# Patient Record
Sex: Female | Born: 1970 | Race: White | Hispanic: Yes | Smoking: Current some day smoker
Health system: Southern US, Community
[De-identification: ages and names within clinical notes are randomized; demographics above are authoritative.]

## PROBLEM LIST (undated history)

## (undated) DIAGNOSIS — E785 Hyperlipidemia, unspecified: Secondary | ICD-10-CM

## (undated) DIAGNOSIS — L659 Nonscarring hair loss, unspecified: Secondary | ICD-10-CM

## (undated) HISTORY — DX: Nonscarring hair loss, unspecified: L65.9

## (undated) HISTORY — DX: Hyperlipidemia, unspecified: E78.5

---

## 2003-10-31 ENCOUNTER — Other Ambulatory Visit: Admission: RE | Admit: 2003-10-31 | Discharge: 2003-10-31 | Payer: Self-pay | Admitting: Obstetrics and Gynecology

## 2003-11-01 ENCOUNTER — Other Ambulatory Visit: Admission: RE | Admit: 2003-11-01 | Discharge: 2003-11-01 | Payer: Self-pay | Admitting: Obstetrics and Gynecology

## 2004-02-08 ENCOUNTER — Inpatient Hospital Stay (HOSPITAL_COMMUNITY): Admission: AD | Admit: 2004-02-08 | Discharge: 2004-02-12 | Payer: Self-pay | Admitting: *Deleted

## 2015-03-13 ENCOUNTER — Ambulatory Visit: Payer: Self-pay | Admitting: Physician Assistant

## 2015-03-13 ENCOUNTER — Encounter: Payer: Self-pay | Admitting: Physician Assistant

## 2015-03-13 VITALS — BP 124/80 | HR 90 | Temp 98.4°F | Ht 63.0 in | Wt 196.6 lb

## 2015-03-13 DIAGNOSIS — E785 Hyperlipidemia, unspecified: Secondary | ICD-10-CM

## 2015-03-13 DIAGNOSIS — F17210 Nicotine dependence, cigarettes, uncomplicated: Secondary | ICD-10-CM

## 2015-03-13 NOTE — Progress Notes (Signed)
   BP 124/80 mmHg  Pulse 90  Temp(Src) 98.4 F (36.9 C)  Ht 5\' 3"  (1.6 m)  Wt 196 lb 9.6 oz (89.177 kg)  BMI 34.83 kg/m2  SpO2 98%   Subjective:    Patient ID: Jessica Kennedy, female    DOB: 11/20/1970, 44 y.o.   MRN: [id number]  HPI: Jessica Kennedy is a 44 y.o. female presenting on 03/13/2015 for Hyperlipidemia   HPI   Pt is still going to faith in families She did not take the fish oil that was rec for her at her last OV.  Relevant past medical, surgical, family and social history reviewed and updated as indicated. Interim medical history since our last visit reviewed. Allergies and medications reviewed and updated.  Review of Systems  Constitutional: Negative for fever, chills, diaphoresis, appetite change, fatigue and unexpected weight change.  HENT: Positive for congestion and sneezing. Negative for dental problem, drooling, ear pain, facial swelling, hearing loss, mouth sores, sore throat, trouble swallowing and voice change.   Eyes: Positive for redness. Negative for pain, discharge, itching and visual disturbance.  Respiratory: Negative for cough, choking, shortness of breath and wheezing.   Cardiovascular: Negative for chest pain, palpitations and leg swelling.  Gastrointestinal: Negative for vomiting, abdominal pain, diarrhea, constipation and blood in stool.  Endocrine: Negative for cold intolerance, heat intolerance and polydipsia.  Genitourinary: Negative for dysuria, hematuria and decreased urine volume.  Musculoskeletal: Negative for back pain, arthralgias and gait problem.  Skin: Negative for rash.  Allergic/Immunologic: Negative for environmental allergies.  Neurological: Negative for seizures, syncope, light-headedness and headaches.  Hematological: Negative for adenopathy.  Psychiatric/Behavioral: Negative for suicidal ideas, dysphoric mood and agitation. The patient is not nervous/anxious.     Per HPI unless specifically  indicated above     Objective:    BP 124/80 mmHg  Pulse 90  Temp(Src) 98.4 F (36.9 C)  Ht 5\' 3"  (1.6 m)  Wt 196 lb 9.6 oz (89.177 kg)  BMI 34.83 kg/m2  SpO2 98%  Wt Readings from Last 3 Encounters:  03/13/15 196 lb 9.6 oz (89.177 kg)    Physical Exam  Constitutional: She is oriented to person, place, and time. She appears well-developed and well-nourished.  HENT:  Head: Normocephalic and atraumatic.  Neck: Neck supple.  Cardiovascular: Normal rate and regular rhythm.   Pulmonary/Chest: Effort normal and breath sounds normal.  Abdominal: Soft. Bowel sounds are normal. She exhibits no mass. There is no tenderness.  Musculoskeletal: She exhibits no edema.  Lymphadenopathy:    She has no cervical adenopathy.  Neurological: She is alert and oriented to person, place, and time.  Skin: Skin is warm and dry.  Psychiatric: She has a normal mood and affect. Her behavior is normal.  Vitals reviewed.   No results found for this or any previous visit.    Assessment & Plan:   Encounter Diagnoses  Name Primary?  . Hyperlipemia Yes  . Cigarette nicotine dependence, uncomplicated     -check fasting lipids. Will call with results -counseled on smoking cessaton -f/u 3 mo. rto sooner prn

## 2015-03-13 NOTE — Patient Instructions (Signed)
Fat and Cholesterol Restricted Diet High levels of fat and cholesterol in your blood may lead to various health problems, such as diseases of the heart, blood vessels, gallbladder, liver, and pancreas. Fats are concentrated sources of energy that come in various forms. Certain types of fat, including saturated fat, may be harmful in excess. Cholesterol is a substance needed by your body in small amounts. Your body makes all the cholesterol it needs. Excess cholesterol comes from the food you eat. When you have high levels of cholesterol and saturated fat in your blood, health problems can develop because the excess fat and cholesterol will gather along the walls of your blood vessels, causing them to narrow. Choosing the right foods will help you control your intake of fat and cholesterol. This will help keep the levels of these substances in your blood within normal limits and reduce your risk of disease. WHAT IS MY PLAN? Your health care provider recommends that you:  Get no more than __________ % of the total calories in your daily diet from fat.  Limit your intake of saturated fat to less than ______% of your total calories each day.  Limit the amount of cholesterol in your diet to less than _________mg per day. WHAT TYPES OF FAT SHOULD I CHOOSE?  Choose healthy fats more often. Choose monounsaturated and polyunsaturated fats, such as olive and canola oil, flaxseeds, walnuts, almonds, and seeds.  Eat more omega-3 fats. Good choices include salmon, mackerel, sardines, tuna, flaxseed oil, and ground flaxseeds. Aim to eat fish at least two times a week.  Limit saturated fats. Saturated fats are primarily found in animal products, such as meats, butter, and cream. Plant sources of saturated fats include palm oil, palm kernel oil, and coconut oil.  Avoid foods with partially hydrogenated oils in them. These contain trans fats. Examples of foods that contain trans fats are stick margarine, some tub  margarines, cookies, crackers, and other baked goods. WHAT GENERAL GUIDELINES DO I NEED TO FOLLOW? These guidelines for healthy eating will help you control your intake of fat and cholesterol:  Check food labels carefully to identify foods with trans fats or high amounts of saturated fat.  Fill one half of your plate with vegetables and green salads.  Fill one fourth of your plate with whole grains. Look for the word "whole" as the first word in the ingredient list.  Fill one fourth of your plate with lean protein foods.  Limit fruit to two servings a day. Choose fruit instead of juice.  Eat more foods that contain soluble fiber. Examples of foods that contain this type of fiber are apples, broccoli, carrots, beans, peas, and barley. Aim to get 20-30 g of fiber per day.  Eat more home-cooked food and less restaurant, buffet, and fast food.  Limit or avoid alcohol.  Limit foods high in starch and sugar.  Limit fried foods.  Cook foods using methods other than frying. Baking, boiling, grilling, and broiling are all great options.  Lose weight if you are overweight. Losing just 5-10% of your initial body weight can help your overall health and prevent diseases such as diabetes and heart disease. WHAT FOODS CAN I EAT? Grains Whole grains, such as whole wheat or whole grain breads, crackers, cereals, and pasta. Unsweetened oatmeal, bulgur, barley, quinoa, or brown rice. Corn or whole wheat flour tortillas. Vegetables Fresh or frozen vegetables (raw, steamed, roasted, or grilled). Green salads. Fruits All fresh, canned (in natural juice), or frozen fruits. Meat and   Other Protein Products Ground beef (85% or leaner), grass-fed beef, or beef trimmed of fat. Skinless chicken or turkey. Ground chicken or turkey. Pork trimmed of fat. All fish and seafood. Eggs. Dried beans, peas, or lentils. Unsalted nuts or seeds. Unsalted canned or dry beans. Dairy Low-fat dairy products, such as skim or  1% milk, 2% or reduced-fat cheeses, low-fat ricotta or cottage cheese, or plain low-fat yogurt. Fats and Oils Tub margarines without trans fats. Light or reduced-fat mayonnaise and salad dressings. Avocado. Olive, canola, sesame, or safflower oils. Natural peanut or almond butter (choose ones without added sugar and oil). The items listed above may not be a complete list of recommended foods or beverages. Contact your dietitian for more options. WHAT FOODS ARE NOT RECOMMENDED? Grains White bread. White pasta. White rice. Cornbread. Bagels, pastries, and croissants. Crackers that contain trans fat. Vegetables White potatoes. Corn. Creamed or fried vegetables. Vegetables in a cheese sauce. Fruits Dried fruits. Canned fruit in light or heavy syrup. Fruit juice. Meat and Other Protein Products Fatty cuts of meat. Ribs, chicken wings, bacon, sausage, bologna, salami, chitterlings, fatback, hot dogs, bratwurst, and packaged luncheon meats. Liver and organ meats. Dairy Whole or 2% milk, cream, half-and-half, and cream cheese. Whole milk cheeses. Whole-fat or sweetened yogurt. Full-fat cheeses. Nondairy creamers and whipped toppings. Processed cheese, cheese spreads, or cheese curds. Sweets and Desserts Corn syrup, sugars, honey, and molasses. Candy. Jam and jelly. Syrup. Sweetened cereals. Cookies, pies, cakes, donuts, muffins, and ice cream. Fats and Oils Butter, stick margarine, lard, shortening, ghee, or bacon fat. Coconut, palm kernel, or palm oils. Beverages Alcohol. Sweetened drinks (such as sodas, lemonade, and fruit drinks or punches). The items listed above may not be a complete list of foods and beverages to avoid. Contact your dietitian for more information.   This information is not intended to replace advice given to you by your health care provider. Make sure you discuss any questions you have with your health care provider.   Document Released: 03/22/2005 Document Revised: 04/12/2014  Document Reviewed: 06/20/2013 Elsevier Interactive Patient Education 2016 [employer]   Smoking Cessation, Tips for Success If you are ready to quit smoking, congratulations! You have chosen to help yourself be healthier. Cigarettes bring nicotine, tar, carbon monoxide, and other irritants into your body. Your lungs, heart, and blood vessels will be able to work better without these poisons. There are many different ways to quit smoking. Nicotine gum, nicotine patches, a nicotine inhaler, or nicotine nasal spray can help with physical craving. Hypnosis, support groups, and medicines help break the habit of smoking. WHAT THINGS CAN I DO TO MAKE QUITTING EASIER?  Here are some tips to help you quit for good:  Pick a date when you will quit smoking completely. Tell all of your friends and family about your plan to quit on that date.  Do not try to slowly cut down on the number of cigarettes you are smoking. Pick a quit date and quit smoking completely starting on that day.  Throw away all cigarettes.   Clean and remove all ashtrays from your home, work, and car.  On a card, write down your reasons for quitting. Carry the card with you and read it when you get the urge to smoke.  Cleanse your body of nicotine. Drink enough water and fluids to keep your urine clear or pale yellow. Do this after quitting to flush the nicotine from your body.  Learn to predict your moods. Do not let a   bad situation be your excuse to have a cigarette. Some situations in your life might tempt you into wanting a cigarette.  Never have "just one" cigarette. It leads to wanting another and another. Remind yourself of your decision to quit.  Change habits associated with smoking. If you smoked while driving or when feeling stressed, try other activities to replace smoking. Stand up when drinking your coffee. Brush your teeth after eating. Sit in a different chair when you read the paper. Avoid alcohol while trying to  quit, and try to drink fewer caffeinated beverages. Alcohol and caffeine may urge you to smoke.  Avoid foods and drinks that can trigger a desire to smoke, such as sugary or spicy foods and alcohol.  Ask people who smoke not to smoke around you.  Have something planned to do right after eating or having a cup of coffee. For example, plan to take a walk or exercise.  Try a relaxation exercise to calm you down and decrease your stress. Remember, you may be tense and nervous for the first 2 weeks after you quit, but this will pass.  Find new activities to keep your hands busy. Play with a pen, coin, or rubber band. Doodle or draw things on paper.  Brush your teeth right after eating. This will help cut down on the craving for the taste of tobacco after meals. You can also try mouthwash.   Use oral substitutes in place of cigarettes. Try using lemon drops, carrots, cinnamon sticks, or chewing gum. Keep them handy so they are available when you have the urge to smoke.  When you have the urge to smoke, try deep breathing.  Designate your home as a nonsmoking area.  If you are a heavy smoker, ask your health care provider about a prescription for nicotine chewing gum. It can ease your withdrawal from nicotine.  Reward yourself. Set aside the cigarette money you save and buy yourself something nice.  Look for support from others. Join a support group or smoking cessation program. Ask someone at home or at work to help you with your plan to quit smoking.  Always ask yourself, "Do I need this cigarette or is this just a reflex?" Tell yourself, "Today, I choose not to smoke," or "I do not want to smoke." You are reminding yourself of your decision to quit.  Do not replace cigarette smoking with electronic cigarettes (commonly called e-cigarettes). The safety of e-cigarettes is unknown, and some may contain harmful chemicals.  If you relapse, do not give up! Plan ahead and think about what you  will do the next time you get the urge to smoke. HOW WILL I FEEL WHEN I QUIT SMOKING? You may have symptoms of withdrawal because your body is used to nicotine (the addictive substance in cigarettes). You may crave cigarettes, be irritable, feel very hungry, cough often, get headaches, or have difficulty concentrating. The withdrawal symptoms are only temporary. They are strongest when you first quit but will go away within 10-14 days. When withdrawal symptoms occur, stay in control. Think about your reasons for quitting. Remind yourself that these are signs that your body is healing and getting used to being without cigarettes. Remember that withdrawal symptoms are easier to treat than the major diseases that smoking can cause.  Even after the withdrawal is over, expect periodic urges to smoke. However, these cravings are generally short lived and will go away whether you smoke or not. Do not smoke! WHAT RESOURCES ARE AVAILABLE TO   HELP ME QUIT SMOKING? Your health care provider can direct you to community resources or hospitals for support, which may include:  Group support.  Education.  Hypnosis.  Therapy.   This information is not intended to replace advice given to you by your health care provider. Make sure you discuss any questions you have with your health care provider.   Document Released: 12/19/2003 Document Revised: 04/12/2014 Document Reviewed: 09/07/2012 Elsevier Interactive Patient Education 2016 [employer]  

## 2015-03-19 DIAGNOSIS — E785 Hyperlipidemia, unspecified: Secondary | ICD-10-CM | POA: Insufficient documentation

## 2015-03-19 DIAGNOSIS — F17210 Nicotine dependence, cigarettes, uncomplicated: Secondary | ICD-10-CM | POA: Insufficient documentation

## 2015-06-03 ENCOUNTER — Other Ambulatory Visit: Payer: Self-pay | Admitting: Student

## 2015-06-03 DIAGNOSIS — E785 Hyperlipidemia, unspecified: Secondary | ICD-10-CM

## 2015-06-11 ENCOUNTER — Encounter: Payer: Self-pay | Admitting: Physician Assistant

## 2015-06-11 ENCOUNTER — Ambulatory Visit: Payer: Self-pay | Admitting: Physician Assistant

## 2015-06-11 VITALS — BP 108/72 | HR 65 | Temp 97.7°F | Ht 63.0 in | Wt 190.0 lb

## 2015-06-11 DIAGNOSIS — E785 Hyperlipidemia, unspecified: Secondary | ICD-10-CM

## 2015-06-11 DIAGNOSIS — E669 Obesity, unspecified: Secondary | ICD-10-CM

## 2015-06-11 NOTE — Progress Notes (Signed)
   BP 108/72 mmHg  Pulse 65  Temp(Src) 97.7 F (36.5 C)  Ht 5\' 3"  (1.6 m)  Wt 190 lb (86.183 kg)  BMI 33.67 kg/m2  SpO2 99%   Subjective:    Patient ID: Jessica Kennedy, female    DOB: 02/13/1971, 45 y.o.   MRN: [id number]  HPI: Jessica Kennedy is a 45 y.o. female presenting on 06/11/2015 for Hyperlipidemia   HPI   -pt is not taking her fish oil -She quit smoking -She says she is doing well   Relevant past medical, surgical, family and social history reviewed and updated as indicated. Interim medical history since our last visit reviewed. Allergies and medications reviewed and updated.  No current outpatient prescriptions on file.   Review of Systems  Constitutional: Negative for fever, chills, diaphoresis, appetite change, fatigue and unexpected weight change.  HENT: Negative for congestion, dental problem, drooling, ear pain, facial swelling, hearing loss, mouth sores, sneezing, sore throat, trouble swallowing and voice change.   Eyes: Negative for pain, discharge, redness, itching and visual disturbance.  Respiratory: Negative for cough, choking, shortness of breath and wheezing.   Cardiovascular: Negative for chest pain, palpitations and leg swelling.  Gastrointestinal: Negative for vomiting, abdominal pain, diarrhea, constipation and blood in stool.  Endocrine: Negative for cold intolerance, heat intolerance and polydipsia.  Genitourinary: Negative for dysuria, hematuria and decreased urine volume.  Musculoskeletal: Negative for back pain, arthralgias and gait problem.  Skin: Negative for rash.  Allergic/Immunologic: Negative for environmental allergies.  Neurological: Negative for seizures, syncope, light-headedness and headaches.  Hematological: Negative for adenopathy.  Psychiatric/Behavioral: Negative for suicidal ideas, dysphoric mood and agitation. The patient is not nervous/anxious.     Per HPI unless specifically indicated above      Objective:    BP 108/72 mmHg  Pulse 65  Temp(Src) 97.7 F (36.5 C)  Ht 5\' 3"  (1.6 m)  Wt 190 lb (86.183 kg)  BMI 33.67 kg/m2  SpO2 99%  Wt Readings from Last 3 Encounters:  06/11/15 190 lb (86.183 kg)  03/13/15 196 lb 9.6 oz (89.177 kg)    Physical Exam  Constitutional: She is oriented to person, place, and time. She appears well-developed and well-nourished.  HENT:  Head: Normocephalic and atraumatic.  Neck: Neck supple.  Cardiovascular: Normal rate and regular rhythm.   Pulmonary/Chest: Effort normal and breath sounds normal.  Abdominal: Soft. Bowel sounds are normal. She exhibits no mass. There is no hepatosplenomegaly. There is no tenderness.  Musculoskeletal: She exhibits no edema.  Lymphadenopathy:    She has no cervical adenopathy.  Neurological: She is alert and oriented to person, place, and time.  Skin: Skin is warm and dry.  Psychiatric: She has a normal mood and affect. Her behavior is normal.  Vitals reviewed.       Assessment & Plan:   Encounter Diagnoses  Name Primary?  . Hyperlipidemia Yes  . Obesity, unspecified     -Get fasting labs drawn- will call with results -congratulations on stopping smoking -F/u 6 months.   RTO sooner prn

## 2015-06-12 ENCOUNTER — Other Ambulatory Visit: Payer: Self-pay | Admitting: Physician Assistant

## 2015-06-13 LAB — LIPID PANEL
CHOLESTEROL: 181 mg/dL (ref 125–200)
HDL: 23 mg/dL — AB (ref >=46)
TRIGLYCERIDES: 524 mg/dL — AB (ref <150)
Total CHOL/HDL Ratio: 7.9 Ratio — ABNORMAL HIGH (ref <=5.0)

## 2015-06-15 DIAGNOSIS — E669 Obesity, unspecified: Secondary | ICD-10-CM | POA: Insufficient documentation

## 2015-09-03 ENCOUNTER — Other Ambulatory Visit: Payer: Self-pay | Admitting: Physician Assistant

## 2015-09-03 DIAGNOSIS — E785 Hyperlipidemia, unspecified: Secondary | ICD-10-CM

## 2015-12-02 ENCOUNTER — Other Ambulatory Visit: Payer: Self-pay | Admitting: Student

## 2015-12-02 DIAGNOSIS — E785 Hyperlipidemia, unspecified: Secondary | ICD-10-CM

## 2015-12-11 ENCOUNTER — Encounter: Payer: Self-pay | Admitting: Physician Assistant

## 2015-12-11 ENCOUNTER — Ambulatory Visit: Payer: Self-pay | Admitting: Physician Assistant

## 2015-12-11 VITALS — BP 106/60 | HR 80 | Temp 97.7°F | Ht 63.0 in | Wt 187.8 lb

## 2015-12-11 DIAGNOSIS — E785 Hyperlipidemia, unspecified: Secondary | ICD-10-CM

## 2015-12-11 DIAGNOSIS — E669 Obesity, unspecified: Secondary | ICD-10-CM

## 2015-12-11 DIAGNOSIS — F17210 Nicotine dependence, cigarettes, uncomplicated: Secondary | ICD-10-CM

## 2015-12-11 LAB — LIPID PANEL
CHOL/HDL RATIO: 6.3 ratio — AB (ref <=5.0)
Cholesterol: 234 mg/dL — ABNORMAL HIGH (ref 125–200)
HDL: 37 mg/dL — AB (ref >=46)
LDL CALC: 124 mg/dL (ref <130)
TRIGLYCERIDES: 366 mg/dL — AB (ref <150)
VLDL: 73 mg/dL — ABNORMAL HIGH (ref <30)

## 2015-12-11 LAB — COMPLETE METABOLIC PANEL WITH GFR
ALT: 16 U/L (ref 6–29)
AST: 18 U/L (ref 10–30)
Albumin: 4.1 g/dL (ref 3.6–5.1)
Alkaline Phosphatase: 56 U/L (ref 33–115)
BILIRUBIN TOTAL: 0.6 mg/dL (ref 0.2–1.2)
BUN: 12 mg/dL (ref 7–25)
CHLORIDE: 106 mmol/L (ref 98–110)
CO2: 22 mmol/L (ref 20–31)
Calcium: 9.1 mg/dL (ref 8.6–10.2)
Creat: 0.75 mg/dL (ref 0.50–1.10)
GLUCOSE: 89 mg/dL (ref 65–99)
POTASSIUM: 4.1 mmol/L (ref 3.5–5.3)
SODIUM: 139 mmol/L (ref 135–146)
TOTAL PROTEIN: 6.5 g/dL (ref 6.1–8.1)

## 2015-12-11 NOTE — Progress Notes (Signed)
BP 106/60 (BP Location: Left Arm, Patient Position: Sitting, Cuff Size: Normal)   Pulse 80   Temp 97.7 F (36.5 C)   Ht 5\' 3"  (1.6 m)   Wt 187 lb 12.8 oz (85.2 kg)   SpO2 99%   BMI 33.27 kg/m    Subjective:    Patient ID: Jessica Kennedy, female    DOB: 08-19-1970, 45 y.o.   MRN: [id number]  HPI: Sibyl Kennedy is a 45 y.o. female presenting on 12/11/2015 for Hyperlipidemia   HPI   Pt started smoking again- lots of stress with court/divorce/children issues.  Pt says she only takes her fish oils about half the time.  Forgets it about 3 or 4 times/week.  Relevant past medical, surgical, family and social history reviewed and updated as indicated. Interim medical history since our last visit reviewed. Allergies and medications reviewed and updated.   Current Outpatient Prescriptions:  .  Omega-3 Fatty Acids (FISH OIL PO), Take 4 capsules by mouth daily., Disp: , Rfl:   Review of Systems  Constitutional: Negative for appetite change, chills, diaphoresis, fatigue, fever and unexpected weight change.  HENT: Negative for congestion, drooling, ear pain, facial swelling, hearing loss, mouth sores, sneezing, sore throat, trouble swallowing and voice change.   Eyes: Negative for pain, discharge, redness, itching and visual disturbance.  Respiratory: Negative for cough, choking, shortness of breath and wheezing.   Cardiovascular: Negative for chest pain, palpitations and leg swelling.  Gastrointestinal: Positive for constipation. Negative for abdominal pain, blood in stool, diarrhea and vomiting.  Endocrine: Negative for cold intolerance, heat intolerance and polydipsia.  Genitourinary: Negative for decreased urine volume, dysuria and hematuria.  Musculoskeletal: Positive for back pain. Negative for arthralgias and gait problem.  Skin: Negative for rash.  Allergic/Immunologic: Negative for environmental allergies.  Neurological: Negative for seizures,  syncope, light-headedness and headaches.  Hematological: Negative for adenopathy.  Psychiatric/Behavioral: Negative for agitation, dysphoric mood and suicidal ideas. The patient is nervous/anxious.     Per HPI unless specifically indicated above     Objective:    BP 106/60 (BP Location: Left Arm, Patient Position: Sitting, Cuff Size: Normal)   Pulse 80   Temp 97.7 F (36.5 C)   Ht 5\' 3"  (1.6 m)   Wt 187 lb 12.8 oz (85.2 kg)   SpO2 99%   BMI 33.27 kg/m   Wt Readings from Last 3 Encounters:  12/11/15 187 lb 12.8 oz (85.2 kg)  06/11/15 190 lb (86.2 kg)  03/13/15 196 lb 9.6 oz (89.2 kg)    Physical Exam  Constitutional: She is oriented to person, place, and time. She appears well-developed and well-nourished.  HENT:  Head: Normocephalic and atraumatic.  Neck: Neck supple.  Cardiovascular: Normal rate and regular rhythm.   Pulmonary/Chest: Effort normal and breath sounds normal.  Abdominal: Soft. Bowel sounds are normal. She exhibits no mass. There is no hepatosplenomegaly. There is no tenderness.  Musculoskeletal: She exhibits no edema.  Lymphadenopathy:    She has no cervical adenopathy.  Neurological: She is alert and oriented to person, place, and time.  Skin: Skin is warm and dry.  Psychiatric: She has a normal mood and affect. Her behavior is normal.  Vitals reviewed.   Results for orders placed or performed in visit on 12/02/15  COMPLETE METABOLIC PANEL WITH GFR  Result Value Ref Range   Sodium 139 135 - 146 mmol/L   Potassium 4.1 3.5 - 5.3 mmol/L   Chloride 106 98 - 110 mmol/L   CO2 22 20 -  31 mmol/L   Glucose, Bld 89 65 - 99 mg/dL   BUN 12 7 - 25 mg/dL   Creat 1.610.75 0.960.50 - 0.451.10 mg/dL   Total Bilirubin 0.6 0.2 - 1.2 mg/dL   Alkaline Phosphatase 56 33 - 115 U/L   AST 18 10 - 30 U/L   ALT 16 6 - 29 U/L   Total Protein 6.5 6.1 - 8.1 g/dL   Albumin 4.1 3.6 - 5.1 g/dL   Calcium 9.1 8.6 - 40.910.2 mg/dL   GFR, Est African American >89 >=60 mL/min   GFR, Est Non  African American >89 >=60 mL/min  Lipid Profile  Result Value Ref Range   Cholesterol 234 (H) 125 - 200 mg/dL   Triglycerides 811366 (H) <150 mg/dL   HDL 37 (L) >=91>=46 mg/dL   Total CHOL/HDL Ratio 6.3 (H) <=5.0 Ratio   VLDL 73 (H) <30 mg/dL   LDL Cholesterol 478124 <295<130 mg/dL      Assessment & Plan:    Encounter Diagnoses  Name Primary?  . Hyperlipidemia Yes  . Cigarette nicotine dependence, uncomplicated   . Obesity, unspecified     -reviewed labs with pt. -discussed long term risks of hyperlipidemia.  Discussed ways of lowering it including taking fish oil daily, smoking cessation, lowfat diet, regular exercise. Pt says she will make it a priority to take her fish oil every day -counseled smoking cessation -f/u 4 months.  RTO sooner prn

## 2016-04-08 ENCOUNTER — Other Ambulatory Visit: Payer: Self-pay

## 2016-04-08 DIAGNOSIS — E785 Hyperlipidemia, unspecified: Secondary | ICD-10-CM

## 2016-04-09 LAB — LIPID PANEL
Cholesterol: 237 mg/dL — ABNORMAL HIGH (ref <200)
HDL: 32 mg/dL — ABNORMAL LOW (ref >50)
LDL CALC: 130 mg/dL — AB (ref <100)
Total CHOL/HDL Ratio: 7.4 Ratio — ABNORMAL HIGH (ref <5.0)
Triglycerides: 374 mg/dL — ABNORMAL HIGH (ref <150)
VLDL: 75 mg/dL — ABNORMAL HIGH (ref <30)

## 2016-04-15 ENCOUNTER — Ambulatory Visit: Payer: Self-pay | Admitting: Physician Assistant

## 2016-04-15 ENCOUNTER — Encounter: Payer: Self-pay | Admitting: Physician Assistant

## 2016-04-15 VITALS — BP 106/74 | HR 63 | Temp 98.1°F | Ht 63.0 in | Wt 198.4 lb

## 2016-04-15 DIAGNOSIS — E669 Obesity, unspecified: Secondary | ICD-10-CM

## 2016-04-15 DIAGNOSIS — E785 Hyperlipidemia, unspecified: Secondary | ICD-10-CM

## 2016-04-15 DIAGNOSIS — E66812 Obesity, class 2: Secondary | ICD-10-CM

## 2016-04-15 DIAGNOSIS — Z6835 Body mass index (BMI) 35.0-35.9, adult: Secondary | ICD-10-CM

## 2016-04-15 NOTE — Progress Notes (Signed)
BP 106/74 (BP Location: Left Arm, Patient Position: Sitting, Cuff Size: Normal)   Pulse 63   Temp 98.1 F (36.7 C) (Other (Comment))   Ht 5\' 3"  (1.6 m)   Wt 198 lb 6.4 oz (90 kg)   LMP 04/06/2005 (Exact Date)   SpO2 98%   BMI 35.14 kg/m    Subjective:    Patient ID: Jessica Kennedy, female    DOB: 1970-07-21, 46 y.o.   MRN: [id number]  HPI: Jessica Kennedy is a 46 y.o. female presenting on 04/15/2016 for Hyperlipidemia   HPI   Pt was taking fish oil but ran out 2 wk ago and hasn't gotten more yet  Pt states no smoking since before Christmas  Pt states still some stress issues but things seem to be going better for her  Relevant past medical, surgical, family and social history reviewed and updated as indicated. Interim medical history since our last visit reviewed. Allergies and medications reviewed and updated.   Current Outpatient Prescriptions:  .  Omega-3 Fatty Acids (FISH OIL PO), Take 4 capsules by mouth daily., Disp: , Rfl:    Review of Systems  Constitutional: Positive for appetite change. Negative for chills, diaphoresis, fatigue, fever and unexpected weight change.  HENT: Positive for dental problem. Negative for congestion, drooling, ear pain, facial swelling, hearing loss, mouth sores, sneezing, sore throat, trouble swallowing and voice change.   Eyes: Negative for pain, discharge, redness, itching and visual disturbance.  Respiratory: Negative for cough, choking, shortness of breath and wheezing.   Cardiovascular: Negative for chest pain, palpitations and leg swelling.  Gastrointestinal: Negative for abdominal pain, blood in stool, constipation, diarrhea and vomiting.  Endocrine: Negative for cold intolerance, heat intolerance and polydipsia.  Genitourinary: Negative for decreased urine volume, dysuria and hematuria.  Musculoskeletal: Negative for arthralgias, back pain and gait problem.  Skin: Negative for rash.   Allergic/Immunologic: Negative for environmental allergies.  Neurological: Negative for seizures, syncope, light-headedness and headaches.  Hematological: Negative for adenopathy.  Psychiatric/Behavioral: Negative for agitation, dysphoric mood and suicidal ideas. The patient is not nervous/anxious.     Per HPI unless specifically indicated above     Objective:    BP 106/74 (BP Location: Left Arm, Patient Position: Sitting, Cuff Size: Normal)   Pulse 63   Temp 98.1 F (36.7 C) (Other (Comment))   Ht 5\' 3"  (1.6 m)   Wt 198 lb 6.4 oz (90 kg)   LMP 04/06/2005 (Exact Date)   SpO2 98%   BMI 35.14 kg/m   Wt Readings from Last 3 Encounters:  04/15/16 198 lb 6.4 oz (90 kg)  12/11/15 187 lb 12.8 oz (85.2 kg)  06/11/15 190 lb (86.2 kg)    Physical Exam  Constitutional: She is oriented to person, place, and time. She appears well-developed and well-nourished.  HENT:  Head: Normocephalic and atraumatic.  Neck: Neck supple.  Cardiovascular: Normal rate and regular rhythm.   Pulmonary/Chest: Effort normal and breath sounds normal.  Abdominal: Soft. Bowel sounds are normal. She exhibits no mass. There is no hepatosplenomegaly. There is no tenderness.  Musculoskeletal: She exhibits no edema.  Lymphadenopathy:    She has no cervical adenopathy.  Neurological: She is alert and oriented to person, place, and time.  Skin: Skin is warm and dry.  Psychiatric: She has a normal mood and affect. Her behavior is normal.  Vitals reviewed.   Results for orders placed or performed in visit on 04/08/16  Lipid Profile  Result Value Ref Range   Cholesterol  237 (H) <200 mg/dL   Triglycerides 469374 (H) <150 mg/dL   HDL 32 (L) >62>50 mg/dL   Total CHOL/HDL Ratio 7.4 (H) <5.0 Ratio   VLDL 75 (H) <30 mg/dL   LDL Cholesterol 952130 (H) <100 mg/dL      Assessment & Plan:   Encounter Diagnoses  Name Primary?  . Hyperlipidemia, unspecified hyperlipidemia type Yes  . Class 2 obesity with body mass index  (BMI) of 35.0 to 35.9 in adult, unspecified obesity type, unspecified whether serious comorbidity present      -reviewed labs with pt.  Discussed they might have been lower had she not run out of her fish oil 2 weeks ago.   -counseled pt on regular exercise which will benefit her lipids as well as helping her weight. -follow up 4 months.  RTO sooner prn

## 2016-08-12 ENCOUNTER — Other Ambulatory Visit (HOSPITAL_COMMUNITY)
Admission: RE | Admit: 2016-08-12 | Discharge: 2016-08-12 | Disposition: A | Discharge status: Home / Self Care | Payer: Self-pay | Source: Ambulatory Visit | Attending: Physician Assistant | Admitting: Physician Assistant

## 2016-08-12 LAB — LIPID PANEL
Cholesterol: 252 mg/dL — ABNORMAL HIGH (ref 0–200)
HDL: 39 mg/dL — ABNORMAL LOW (ref >40)
LDL Cholesterol: 143 mg/dL — ABNORMAL HIGH (ref 0–99)
Total CHOL/HDL Ratio: 6.5 RATIO
Triglycerides: 350 mg/dL — ABNORMAL HIGH (ref <150)
VLDL: 70 mg/dL — ABNORMAL HIGH (ref 0–40)

## 2016-08-12 LAB — COMPREHENSIVE METABOLIC PANEL
ALBUMIN: 4.1 g/dL (ref 3.5–5.0)
ALK PHOS: 60 U/L (ref 38–126)
ALT: 20 U/L (ref 14–54)
ANION GAP: 9 (ref 5–15)
AST: 21 U/L (ref 15–41)
BILIRUBIN TOTAL: 0.8 mg/dL (ref 0.3–1.2)
BUN: 17 mg/dL (ref 6–20)
CALCIUM: 9.2 mg/dL (ref 8.9–10.3)
CO2: 25 mmol/L (ref 22–32)
Chloride: 104 mmol/L (ref 101–111)
Creatinine, Ser: 0.85 mg/dL (ref 0.44–1.00)
GFR calc Af Amer: >60 mL/min (ref >60)
GFR calc non Af Amer: >60 mL/min (ref >60)
GLUCOSE: 93 mg/dL (ref 65–99)
Potassium: 3.7 mmol/L (ref 3.5–5.1)
Sodium: 138 mmol/L (ref 135–145)
TOTAL PROTEIN: 7 g/dL (ref 6.5–8.1)

## 2016-08-16 ENCOUNTER — Ambulatory Visit: Payer: Self-pay | Admitting: Physician Assistant

## 2016-08-16 ENCOUNTER — Encounter: Payer: Self-pay | Admitting: Physician Assistant

## 2016-08-16 VITALS — BP 104/70 | HR 70 | Temp 98.1°F | Ht 63.0 in | Wt 203.0 lb

## 2016-08-16 DIAGNOSIS — E669 Obesity, unspecified: Secondary | ICD-10-CM

## 2016-08-16 DIAGNOSIS — Z6835 Body mass index (BMI) 35.0-35.9, adult: Secondary | ICD-10-CM

## 2016-08-16 DIAGNOSIS — E785 Hyperlipidemia, unspecified: Secondary | ICD-10-CM

## 2016-08-16 NOTE — Progress Notes (Signed)
BP 104/70 (BP Location: Left Arm, Patient Position: Sitting, Cuff Size: Normal)   Pulse 70   Temp 98.1 F (36.7 C)   Ht 5\' 3"  (1.6 m)   Wt 203 lb (92.1 kg)   SpO2 99%   BMI 35.96 kg/m    Subjective:    Patient ID: Jessica Kennedy, female    DOB: 06/12/1970, 46 y.o.   MRN: [id number]  HPI: Jessica Kennedy is a 46 y.o. female presenting on 08/16/2016 for Hyperlipidemia (pt states she forgets to take her fish oil)   HPI   Pt is not smoking.  She is still working at [employer].  She says she is doing well except is eating a bit more now that she is no longer smoking.  She says she frequently forgets to take her fish oil.     Relevant past medical, surgical, family and social history reviewed and updated as indicated. Interim medical history since our last visit reviewed. Allergies and medications reviewed and updated.   Current Outpatient Prescriptions:  .  Omega-3 Fatty Acids (FISH OIL PO), Take 4 capsules by mouth daily., Disp: , Rfl:    Review of Systems  Constitutional: Negative for appetite change, chills, diaphoresis, fatigue, fever and unexpected weight change.  HENT: Positive for dental problem. Negative for congestion, drooling, ear pain, facial swelling, hearing loss, mouth sores, sneezing, sore throat, trouble swallowing and voice change.   Eyes: Positive for visual disturbance. Negative for pain, discharge, redness and itching.  Respiratory: Negative for cough, choking, shortness of breath and wheezing.   Cardiovascular: Negative for chest pain, palpitations and leg swelling.  Gastrointestinal: Negative for abdominal pain, blood in stool, constipation, diarrhea and vomiting.  Endocrine: Negative for cold intolerance, heat intolerance and polydipsia.  Genitourinary: Negative for decreased urine volume, dysuria and hematuria.  Musculoskeletal: Negative for arthralgias, back pain and gait problem.  Skin: Negative for rash.  Allergic/Immunologic:  Negative for environmental allergies.  Neurological: Negative for seizures, syncope, light-headedness and headaches.  Hematological: Negative for adenopathy.  Psychiatric/Behavioral: Negative for agitation, dysphoric mood and suicidal ideas. The patient is nervous/anxious.     Per HPI unless specifically indicated above     Objective:    BP 104/70 (BP Location: Left Arm, Patient Position: Sitting, Cuff Size: Normal)   Pulse 70   Temp 98.1 F (36.7 C)   Ht 5\' 3"  (1.6 m)   Wt 203 lb (92.1 kg)   SpO2 99%   BMI 35.96 kg/m   Wt Readings from Last 3 Encounters:  08/16/16 203 lb (92.1 kg)  04/15/16 198 lb 6.4 oz (90 kg)  12/11/15 187 lb 12.8 oz (85.2 kg)    Physical Exam  Constitutional: She is oriented to person, place, and time. She appears well-developed and well-nourished.  HENT:  Head: Normocephalic and atraumatic.  Neck: Neck supple.  Cardiovascular: Normal rate and regular rhythm.   Pulmonary/Chest: Effort normal and breath sounds normal.  Abdominal: Soft. Bowel sounds are normal. She exhibits no mass. There is no hepatosplenomegaly. There is no tenderness.  Musculoskeletal: She exhibits no edema.  Lymphadenopathy:    She has no cervical adenopathy.  Neurological: She is alert and oriented to person, place, and time.  Skin: Skin is warm and dry.  Psychiatric: She has a normal mood and affect. Her behavior is normal.  Vitals reviewed.   Results for orders placed or performed during the hospital encounter of 08/12/16  Comprehensive metabolic panel  Result Value Ref Range   Sodium 138 135 -  145 mmol/L   Potassium 3.7 3.5 - 5.1 mmol/L   Chloride 104 101 - 111 mmol/L   CO2 25 22 - 32 mmol/L   Glucose, Bld 93 65 - 99 mg/dL   BUN 17 6 - 20 mg/dL   Creatinine, Ser 1.61 0.44 - 1.00 mg/dL   Calcium 9.2 8.9 - 09.6 mg/dL   Total Protein 7.0 6.5 - 8.1 g/dL   Albumin 4.1 3.5 - 5.0 g/dL   AST 21 15 - 41 U/L   ALT 20 14 - 54 U/L   Alkaline Phosphatase 60 38 - 126 U/L   Total  Bilirubin 0.8 0.3 - 1.2 mg/dL   GFR calc non Af Amer >60 >60 mL/min   GFR calc Af Amer >60 >60 mL/min   Anion gap 9 5 - 15  Lipid panel  Result Value Ref Range   Cholesterol 252 (H) 0 - 200 mg/dL   Triglycerides 045 (H) <150 mg/dL   HDL 39 (L) >40 mg/dL   Total CHOL/HDL Ratio 6.5 RATIO   VLDL 70 (H) 0 - 40 mg/dL   LDL Cholesterol 981 (H) 0 - 99 mg/dL      Assessment & Plan:    Encounter Diagnoses  Name Primary?  . Hyperlipidemia, unspecified hyperlipidemia type Yes  . Class 2 obesity with body mass index (BMI) of 35.0 to 35.9 in adult, unspecified obesity type, unspecified whether serious comorbidity present     -reviewed labs with pt -recommended pt keep her fish oil with her toothbrush to help her remember -encouraged regular exercise to help with the weight -congratulated on continued abstaining from smoking -discussed fibrates for the lipids.  pt desires to not take Rx medication for her cholesterol if she can avoid it.  Pt will try to improve taking the fish oil and will follow lowfat diet and try to exercise -PAP up-to-date. Mammogram due later this year -will recheck 4 months. RTO sooner prn

## 2016-10-07 ENCOUNTER — Other Ambulatory Visit: Payer: Self-pay | Admitting: Physician Assistant

## 2016-10-07 DIAGNOSIS — E785 Hyperlipidemia, unspecified: Secondary | ICD-10-CM

## 2016-12-16 ENCOUNTER — Other Ambulatory Visit (HOSPITAL_COMMUNITY)
Admission: RE | Admit: 2016-12-16 | Discharge: 2016-12-16 | Disposition: A | Discharge status: Home / Self Care | Payer: Self-pay | Source: Ambulatory Visit | Attending: Physician Assistant | Admitting: Physician Assistant

## 2016-12-16 DIAGNOSIS — E785 Hyperlipidemia, unspecified: Secondary | ICD-10-CM | POA: Insufficient documentation

## 2016-12-16 LAB — LIPID PANEL
CHOLESTEROL: 234 mg/dL — AB (ref 0–200)
HDL: 27 mg/dL — ABNORMAL LOW (ref >40)
LDL Cholesterol: UNDETERMINED mg/dL (ref 0–99)
TRIGLYCERIDES: 767 mg/dL — AB (ref <150)
Total CHOL/HDL Ratio: 8.7 RATIO
VLDL: UNDETERMINED mg/dL (ref 0–40)

## 2016-12-21 ENCOUNTER — Ambulatory Visit: Payer: Self-pay | Admitting: Physician Assistant

## 2016-12-21 ENCOUNTER — Encounter: Payer: Self-pay | Admitting: Physician Assistant

## 2016-12-21 VITALS — BP 118/70 | HR 71 | Temp 98.1°F | Ht 63.0 in | Wt 194.0 lb

## 2016-12-21 DIAGNOSIS — E785 Hyperlipidemia, unspecified: Secondary | ICD-10-CM

## 2016-12-21 DIAGNOSIS — F17210 Nicotine dependence, cigarettes, uncomplicated: Secondary | ICD-10-CM

## 2016-12-21 NOTE — Progress Notes (Signed)
   BP 118/70 (BP Location: Left Arm, Patient Position: Sitting, Cuff Size: Large)   Pulse 71   Temp 98.1 F (36.7 C) (Other (Comment))   Ht  (1.6 m)   Wt 194 lb (88 kg)   LMP 12/05/2016 (Approximate)   SpO2 99%   BMI 34.37 kg/m    Subjective:    Patient ID: Jessica Kennedy, female    DOB: 1970-10-22, 46 y.o.   MRN: [id number]  HPI: Jessica Kennedy is a 46 y.o. female presenting on 12/21/2016 for Hyperlipidemia   HPI   Pt is only taking her fish oil "randomly, not consistently".   She is doing well  Relevant past medical, surgical, family and social history reviewed and updated as indicated. Interim medical history since our last visit reviewed. Allergies and medications reviewed and updated.  CURRENT MEDS: Fish oil, sometimes  Review of Systems  Per HPI unless specifically indicated above     Objective:    BP 118/70 (BP Location: Left Arm, Patient Position: Sitting, Cuff Size: Large)   Pulse 71   Temp 98.1 F (36.7 C) (Other (Comment))   Ht  (1.6 m)   Wt 194 lb (88 kg)   LMP 12/05/2016 (Approximate)   SpO2 99%   BMI 34.37 kg/m   Wt Readings from Last 3 Encounters:  12/21/16 194 lb (88 kg)  08/16/16 203 lb (92.1 kg)  04/15/16 198 lb 6.4 oz (90 kg)    Physical Exam  Constitutional: She is oriented to person, place, and time. She appears well-developed and well-nourished.  HENT:  Head: Normocephalic and atraumatic.  Neck: Neck supple.  Cardiovascular: Normal rate and regular rhythm.   Pulmonary/Chest: Effort normal and breath sounds normal.  Abdominal: Soft. Bowel sounds are normal. She exhibits no mass. There is no hepatosplenomegaly. There is no tenderness.  Musculoskeletal: She exhibits no edema.  Lymphadenopathy:    She has no cervical adenopathy.  Neurological: She is alert and oriented to person, place, and time.  Skin: Skin is warm and dry.  Psychiatric: She has a normal mood and affect. Her behavior is normal.  Vitals  reviewed.   Results for orders placed or performed during the hospital encounter of 12/16/16  Lipid panel  Result Value Ref Range   Cholesterol 234 (H) 0 - 200 mg/dL   Triglycerides 562 (H) <150 mg/dL   HDL 27 (L) >13 mg/dL   Total CHOL/HDL Ratio 8.7 RATIO   VLDL UNABLE TO CALCULATE IF TRIGLYCERIDE OVER 400 mg/dL 0 - 40 mg/dL   LDL Cholesterol UNABLE TO CALCULATE IF TRIGLYCERIDE OVER 400 mg/dL 0 - 99 mg/dL      Assessment & Plan:   Encounter Diagnoses  Name Primary?  . Hyperlipidemia, unspecified hyperlipidemia type Yes  . Cigarette nicotine dependence, uncomplicated      -reviewed labs with pt -will order screening Mammogram -counseled pt to Take meds daily -counseled pt on lowfat diet and regular exercise to help the lipids -counseled pt on smoking cessation -pt to follow up 4 months.  RTO sooner prn

## 2017-01-20 ENCOUNTER — Other Ambulatory Visit: Payer: Self-pay | Admitting: Physician Assistant

## 2017-01-20 DIAGNOSIS — Z1231 Encounter for screening mammogram for malignant neoplasm of breast: Secondary | ICD-10-CM

## 2017-02-16 ENCOUNTER — Inpatient Hospital Stay: Admission: RE | Admit: 2017-02-16 | Payer: Self-pay | Source: Ambulatory Visit

## 2017-04-15 ENCOUNTER — Other Ambulatory Visit (HOSPITAL_COMMUNITY)
Admission: RE | Admit: 2017-04-15 | Discharge: 2017-04-15 | Disposition: A | Discharge status: Home / Self Care | Payer: Self-pay | Source: Ambulatory Visit | Attending: Physician Assistant | Admitting: Physician Assistant

## 2017-04-15 DIAGNOSIS — E785 Hyperlipidemia, unspecified: Secondary | ICD-10-CM | POA: Insufficient documentation

## 2017-04-15 LAB — LIPID PANEL
CHOL/HDL RATIO: 9.8 ratio
CHOLESTEROL: 255 mg/dL — AB (ref 0–200)
HDL: 26 mg/dL — AB (ref >40)
LDL CALC: UNDETERMINED mg/dL (ref 0–99)
TRIGLYCERIDES: 1055 mg/dL — AB (ref <150)
VLDL: UNDETERMINED mg/dL (ref 0–40)

## 2017-04-20 ENCOUNTER — Encounter: Payer: Self-pay | Admitting: Physician Assistant

## 2017-04-20 ENCOUNTER — Ambulatory Visit: Payer: Self-pay | Admitting: Physician Assistant

## 2017-04-20 VITALS — BP 106/74 | HR 98 | Temp 97.7°F | Ht 63.0 in | Wt 208.0 lb

## 2017-04-20 DIAGNOSIS — E781 Pure hyperglyceridemia: Secondary | ICD-10-CM

## 2017-04-20 DIAGNOSIS — Z1239 Encounter for other screening for malignant neoplasm of breast: Secondary | ICD-10-CM

## 2017-04-20 DIAGNOSIS — Z91199 Patient's noncompliance with other medical treatment and regimen due to unspecified reason: Secondary | ICD-10-CM

## 2017-04-20 DIAGNOSIS — Z9119 Patient's noncompliance with other medical treatment and regimen: Secondary | ICD-10-CM

## 2017-04-20 DIAGNOSIS — E669 Obesity, unspecified: Secondary | ICD-10-CM

## 2017-04-20 MED ORDER — FENOFIBRATE 145 MG PO TABS: 145.0000 mg | ORAL_TABLET | Freq: Every day | ORAL | 4 refills | Status: DC

## 2017-04-20 NOTE — Progress Notes (Signed)
BP 106/74 (BP Location: Left Arm, Patient Position: Sitting, Cuff Size: Large)   Pulse 98   Temp 97.7 F (36.5 C) (Other (Comment))   Ht  (1.6 m)   Wt 208 lb (94.3 kg)   LMP 04/18/2017 (Exact Date)   SpO2 98%   BMI 36.85 kg/m    Subjective:    Patient ID: Jessica Kennedy, female    DOB: [birth year], 47 y.o.   MRN: [id number]  HPI: Allecia Bells is a 47 y.o. female presenting on 04/20/2017 for Hyperlipidemia   HPI  Despite counseling at last OV pt is not taking her fish oil.  She is not exercising either.   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   Relevant past medical, surgical, family and social history reviewed and updated as indicated. Interim medical history since our last visit reviewed. Allergies and medications reviewed and updated.  CURRENT MEDS: None  Review of Systems  Constitutional: Negative for appetite change, chills, diaphoresis, fatigue, fever and unexpected weight change.  HENT: Negative for congestion, dental problem, drooling, ear pain, facial swelling, hearing loss, mouth sores, sneezing, sore throat, trouble swallowing and voice change.   Eyes: Negative for pain, discharge, redness, itching and visual disturbance.  Respiratory: Negative for cough, choking, shortness of breath and wheezing.   Cardiovascular: Negative for chest pain, palpitations and leg swelling.  Gastrointestinal: Negative for abdominal pain, blood in stool, constipation, diarrhea and vomiting.  Endocrine: Negative for cold intolerance, heat intolerance and polydipsia.  Genitourinary: Negative for decreased urine volume, dysuria and hematuria.  Musculoskeletal: Positive for back pain. Negative for arthralgias and gait problem.  Skin: Negative for rash.  Allergic/Immunologic: Negative for environmental allergies.  Neurological: Positive for headaches.  Negative for seizures, syncope and light-headedness.  Hematological: Negative for adenopathy.  Psychiatric/Behavioral: Negative for agitation, dysphoric mood and suicidal ideas. The patient is not nervous/anxious.     Per HPI unless specifically indicated above     Objective:    BP 106/74 (BP Location: Left Arm, Patient Position: Sitting, Cuff Size: Large)   Pulse 98   Temp 97.7 F (36.5 C) (Other (Comment))   Ht  (1.6 m)   Wt 208 lb (94.3 kg)   LMP 04/18/2017 (Exact Date)   SpO2 98%   BMI 36.85 kg/m   Wt Readings from Last 3 Encounters:  04/20/17 208 lb (94.3 kg)  12/21/16 194 lb (88 kg)  08/16/16 203 lb (92.1 kg)    Physical Exam  Constitutional: She is oriented to person, place, and time. She appears well-developed and well-nourished.  HENT:  Head: Normocephalic and atraumatic.  Neck: Neck supple.  Cardiovascular: Normal rate and regular rhythm.  Pulmonary/Chest: Effort normal and breath sounds normal.  Abdominal: Soft. Bowel sounds are normal. She exhibits no mass. There is no hepatosplenomegaly. There is no tenderness.  Musculoskeletal: She exhibits no edema.  Lymphadenopathy:    She has no cervical adenopathy.  Neurological: She is alert and oriented to person, place, and time.  Skin: Skin is warm and dry.  Psychiatric: She has a normal mood and affect. Her behavior is normal.  Vitals reviewed.   Results for orders placed or performed during the hospital encounter of 04/15/17  Lipid Profile  Result Value Ref Range   Cholesterol 255 (H) 0 - 200 mg/dL   Triglycerides 4,098 (H) <150 mg/dL   HDL 26 (L) >  40 mg/dL   Total CHOL/HDL Ratio 9.8 RATIO   VLDL UNABLE TO CALCULATE IF TRIGLYCERIDE OVER 400 mg/dL 0 - 40 mg/dL   LDL Cholesterol UNABLE TO CALCULATE IF TRIGLYCERIDE OVER 400 mg/dL 0 - 99 mg/dL      Assessment & Plan:   Encounter Diagnoses  Name Primary?  . Hypertriglyceridemia Yes  . Screening for breast cancer   . Obesity, unspecified  classification, unspecified obesity type, unspecified whether serious comorbidity present   . Personal history of noncompliance with medical treatment, presenting hazards to health     -reviewed labs with pt -will Reorder screening mammogram -pt to Stop fish oil and rx fenofibrate.  Counseled on lowfat diet and regular exercise -pt to follow up  3months.  RTO sooner prn

## 2017-04-20 NOTE — Patient Instructions (Signed)

## 2017-05-09 ENCOUNTER — Other Ambulatory Visit: Payer: Self-pay | Admitting: Physician Assistant

## 2017-05-09 DIAGNOSIS — Z1231 Encounter for screening mammogram for malignant neoplasm of breast: Secondary | ICD-10-CM

## 2017-07-15 ENCOUNTER — Other Ambulatory Visit (HOSPITAL_COMMUNITY)
Admission: RE | Admit: 2017-07-15 | Discharge: 2017-07-15 | Disposition: A | Discharge status: Home / Self Care | Payer: Self-pay | Source: Ambulatory Visit | Attending: Physician Assistant | Admitting: Physician Assistant

## 2017-07-15 DIAGNOSIS — E781 Pure hyperglyceridemia: Secondary | ICD-10-CM | POA: Insufficient documentation

## 2017-07-15 LAB — COMPREHENSIVE METABOLIC PANEL
ALBUMIN: 3.9 g/dL (ref 3.5–5.0)
ALT: 25 U/L (ref 14–54)
AST: 26 U/L (ref 15–41)
Alkaline Phosphatase: 61 U/L (ref 38–126)
Anion gap: 11 (ref 5–15)
BUN: 13 mg/dL (ref 6–20)
CHLORIDE: 106 mmol/L (ref 101–111)
CO2: 20 mmol/L — ABNORMAL LOW (ref 22–32)
Calcium: 9 mg/dL (ref 8.9–10.3)
Creatinine, Ser: 0.75 mg/dL (ref 0.44–1.00)
GFR calc Af Amer: >60 mL/min (ref >60)
GFR calc non Af Amer: >60 mL/min (ref >60)
GLUCOSE: 105 mg/dL — AB (ref 65–99)
Potassium: 3.8 mmol/L (ref 3.5–5.1)
Sodium: 137 mmol/L (ref 135–145)
Total Bilirubin: 1.2 mg/dL (ref 0.3–1.2)
Total Protein: 6.9 g/dL (ref 6.5–8.1)

## 2017-07-15 LAB — LIPID PANEL
CHOL/HDL RATIO: 7.4 ratio
Cholesterol: 243 mg/dL — ABNORMAL HIGH (ref 0–200)
HDL: 33 mg/dL — ABNORMAL LOW (ref >40)
LDL Cholesterol: UNDETERMINED mg/dL (ref 0–99)
Triglycerides: 425 mg/dL — ABNORMAL HIGH (ref <150)
VLDL: UNDETERMINED mg/dL (ref 0–40)

## 2017-07-19 ENCOUNTER — Ambulatory Visit: Payer: Self-pay | Admitting: Physician Assistant

## 2017-07-19 ENCOUNTER — Encounter: Payer: Self-pay | Admitting: Physician Assistant

## 2017-07-19 VITALS — BP 106/66 | HR 53 | Temp 97.9°F | Ht 63.0 in | Wt 205.0 lb

## 2017-07-19 DIAGNOSIS — Z1239 Encounter for other screening for malignant neoplasm of breast: Secondary | ICD-10-CM

## 2017-07-19 DIAGNOSIS — E781 Pure hyperglyceridemia: Secondary | ICD-10-CM

## 2017-07-19 MED ORDER — FENOFIBRATE 145 MG PO TABS: 145.0000 mg | ORAL_TABLET | Freq: Every day | ORAL | 4 refills | Status: DC

## 2017-07-19 NOTE — Progress Notes (Signed)
BP 106/66 (BP Location: Right Arm, Patient Position: Sitting, Cuff Size: Normal)   Pulse (!) 53   Temp 97.9 F (36.6 C) (Other (Comment))   Ht 5\' 3"  (1.6 m)   Wt 205 lb (93 kg)   LMP 07/04/2017 (Approximate)   SpO2 99%   BMI 36.31 kg/m    Subjective:    Patient ID: Jessica Kennedy, female    DOB: 10/10/1970, 47 y.o.   MRN: [id number]  HPI: Jessica Kennedy is a 47 y.o. female presenting on 07/19/2017 for Hyperlipidemia   HPI   Pt says she is doing well.  She took her fenofibrate for one month but then didn't get it refilled.   Relevant past medical, surgical, family and social history reviewed and updated as indicated. Interim medical history since our last visit reviewed. Allergies and medications reviewed and updated.  CURRENT MEDS: none  Review of Systems  Constitutional: Positive for appetite change. Negative for chills, diaphoresis, fatigue, fever and unexpected weight change.  HENT: Negative for congestion, dental problem, drooling, ear pain, facial swelling, hearing loss, mouth sores, sneezing, sore throat, trouble swallowing and voice change.   Eyes: Positive for visual disturbance. Negative for pain, discharge, redness and itching.  Respiratory: Positive for shortness of breath. Negative for cough, choking and wheezing.   Cardiovascular: Negative for chest pain, palpitations and leg swelling.  Gastrointestinal: Negative for abdominal pain, blood in stool, constipation, diarrhea and vomiting.  Endocrine: Negative for cold intolerance, heat intolerance and polydipsia.  Genitourinary: Negative for decreased urine volume, dysuria and hematuria.  Musculoskeletal: Negative for arthralgias, back pain and gait problem.  Skin: Negative for rash.  Allergic/Immunologic: Negative for environmental allergies.  Neurological: Negative for seizures, syncope, light-headedness and headaches.  Hematological: Negative for adenopathy.  Psychiatric/Behavioral: Negative for  agitation, dysphoric mood and suicidal ideas. The patient is not nervous/anxious.     Per HPI unless specifically indicated above     Objective:    BP 106/66 (BP Location: Right Arm, Patient Position: Sitting, Cuff Size: Normal)   Pulse (!) 53   Temp 97.9 F (36.6 C) (Other (Comment))   Ht 5\' 3"  (1.6 m)   Wt 205 lb (93 kg)   LMP 07/04/2017 (Approximate)   SpO2 99%   BMI 36.31 kg/m   Wt Readings from Last 3 Encounters:  07/19/17 205 lb (93 kg)  04/20/17 208 lb (94.3 kg)  12/21/16 194 lb (88 kg)    Physical Exam  Constitutional: She is oriented to person, place, and time. She appears well-developed and well-nourished.  HENT:  Head: Normocephalic and atraumatic.  Neck: Neck supple.  Cardiovascular: Normal rate and regular rhythm.  Pulmonary/Chest: Effort normal and breath sounds normal.  Abdominal: Soft. Bowel sounds are normal. She exhibits no mass. There is no hepatosplenomegaly. There is no tenderness.  Musculoskeletal: She exhibits no edema.  Lymphadenopathy:    She has no cervical adenopathy.  Neurological: She is alert and oriented to person, place, and time.  Skin: Skin is warm and dry.  Psychiatric: She has a normal mood and affect. Her behavior is normal.  Vitals reviewed.   Results for orders placed or performed during the hospital encounter of 07/15/17  Comprehensive metabolic panel  Result Value Ref Range   Sodium 137 135 - 145 mmol/L   Potassium 3.8 3.5 - 5.1 mmol/L   Chloride 106 101 - 111 mmol/L   CO2 20 (L) 22 - 32 mmol/L   Glucose, Bld 105 (H) 65 - 99 mg/dL   BUN 13  6 - 20 mg/dL   Creatinine, Ser 1.61 0.44 - 1.00 mg/dL   Calcium 9.0 8.9 - 09.6 mg/dL   Total Protein 6.9 6.5 - 8.1 g/dL   Albumin 3.9 3.5 - 5.0 g/dL   AST 26 15 - 41 U/L   ALT 25 14 - 54 U/L   Alkaline Phosphatase 61 38 - 126 U/L   Total Bilirubin 1.2 0.3 - 1.2 mg/dL   GFR calc non Af Amer >60 >60 mL/min   GFR calc Af Amer >60 >60 mL/min   Anion gap 11 5 - 15  Lipid panel  Result  Value Ref Range   Cholesterol 243 (H) 0 - 200 mg/dL   Triglycerides 045 (H) <150 mg/dL   HDL 33 (L) >40 mg/dL   Total CHOL/HDL Ratio 7.4 RATIO   VLDL UNABLE TO CALCULATE IF TRIGLYCERIDE OVER 400 mg/dL 0 - 40 mg/dL   LDL Cholesterol UNABLE TO CALCULATE IF TRIGLYCERIDE OVER 400 mg/dL 0 - 99 mg/dL      Assessment & Plan:   Encounter Diagnoses  Name Primary?  . Hypertriglyceridemia Yes  . Screening for breast cancer      -pt to Get back on fenofibrate.  Continue lowfat diet -ordered screening Mammogram -Follow up 3 month.  RTO sooner prn

## 2017-10-18 ENCOUNTER — Ambulatory Visit: Payer: Self-pay | Admitting: Physician Assistant

## 2017-11-07 ENCOUNTER — Other Ambulatory Visit (HOSPITAL_COMMUNITY)
Admission: RE | Admit: 2017-11-07 | Discharge: 2017-11-07 | Disposition: A | Discharge status: Home / Self Care | Payer: Self-pay | Source: Ambulatory Visit | Attending: Physician Assistant | Admitting: Physician Assistant

## 2017-11-07 DIAGNOSIS — E781 Pure hyperglyceridemia: Secondary | ICD-10-CM | POA: Insufficient documentation

## 2017-11-07 LAB — COMPREHENSIVE METABOLIC PANEL
ALBUMIN: 4 g/dL (ref 3.5–5.0)
ALK PHOS: 63 U/L (ref 38–126)
ALT: 24 U/L (ref 0–44)
AST: 21 U/L (ref 15–41)
Anion gap: 5 (ref 5–15)
BILIRUBIN TOTAL: 0.9 mg/dL (ref 0.3–1.2)
BUN: 17 mg/dL (ref 6–20)
CALCIUM: 9.1 mg/dL (ref 8.9–10.3)
CO2: 21 mmol/L — AB (ref 22–32)
Chloride: 112 mmol/L — ABNORMAL HIGH (ref 98–111)
Creatinine, Ser: 0.69 mg/dL (ref 0.44–1.00)
GFR calc Af Amer: >60 mL/min (ref >60)
GFR calc non Af Amer: >60 mL/min (ref >60)
GLUCOSE: 103 mg/dL — AB (ref 70–99)
POTASSIUM: 3.9 mmol/L (ref 3.5–5.1)
SODIUM: 138 mmol/L (ref 135–145)
Total Protein: 6.9 g/dL (ref 6.5–8.1)

## 2017-11-07 LAB — LIPID PANEL
CHOL/HDL RATIO: 7.4 ratio
CHOLESTEROL: 222 mg/dL — AB (ref 0–200)
HDL: 30 mg/dL — ABNORMAL LOW (ref >40)
LDL Cholesterol: UNDETERMINED mg/dL (ref 0–99)
Triglycerides: 421 mg/dL — ABNORMAL HIGH (ref <150)
VLDL: UNDETERMINED mg/dL (ref 0–40)

## 2017-11-08 ENCOUNTER — Ambulatory Visit: Payer: Self-pay | Admitting: Physician Assistant

## 2017-11-08 ENCOUNTER — Encounter: Payer: Self-pay | Admitting: Physician Assistant

## 2017-11-08 VITALS — BP 130/78 | HR 59 | Temp 97.7°F | Ht 63.0 in | Wt 201.2 lb

## 2017-11-08 DIAGNOSIS — E781 Pure hyperglyceridemia: Secondary | ICD-10-CM

## 2017-11-08 DIAGNOSIS — Z5320 Procedure and treatment not carried out because of patient's decision for unspecified reasons: Secondary | ICD-10-CM

## 2017-11-08 MED ORDER — FENOFIBRATE 145 MG PO TABS: 145.0000 mg | ORAL_TABLET | Freq: Every day | ORAL | 4 refills | Status: DC

## 2017-11-08 NOTE — Progress Notes (Signed)
BP 130/78 (BP Location: Right Arm, Patient Position: Sitting, Cuff Size: Normal)   Pulse (!) 59   Temp 97.7 F (36.5 C)   Ht 5\' 3"  (1.6 m)   Wt 201 lb 4 oz (91.3 kg)   SpO2 96%   BMI 35.65 kg/m    Subjective:    Patient ID: Jessica Kennedy, female    DOB: 1970-05-27, 47 y.o.   MRN: [id number]  HPI: Jessica Kennedy is a 47 y.o. female presenting on 11/08/2017 for Hyperlipidemia   HPI   Pt says she is doing well.  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  She has quit smoking and has no complaints today  Pt has not been taking her medication because she is putting her cholesterol in God's hands.  Relevant past medical, surgical, family and social history reviewed and updated as indicated. Interim medical history since our last visit reviewed. Allergies and medications reviewed and updated.  CURRENT MEDS: None  Review of Systems  Constitutional: Negative for appetite change, chills, diaphoresis, fatigue, fever and unexpected weight change.  HENT: Negative for congestion, dental problem, drooling, ear pain, facial swelling, hearing loss, mouth sores, sneezing, sore throat, trouble swallowing and voice change.   Eyes: Negative for pain, discharge, redness, itching and visual disturbance.  Respiratory: Negative for cough, choking, shortness of breath and wheezing.   Cardiovascular: Negative for chest pain, palpitations and leg swelling.  Gastrointestinal: Negative for abdominal pain, blood in stool, constipation, diarrhea and vomiting.  Endocrine: Negative for cold intolerance, heat intolerance and polydipsia.  Genitourinary: Negative for decreased urine volume, dysuria and hematuria.  Musculoskeletal: Negative for arthralgias, back pain and gait problem.  Skin: Negative for rash.  Allergic/Immunologic: Negative for environmental allergies.  Neurological: Negative for seizures, syncope, light-headedness and headaches.  Hematological:  Negative for adenopathy.  Psychiatric/Behavioral: Negative for agitation, dysphoric mood and suicidal ideas. The patient is not nervous/anxious.     Per HPI unless specifically indicated above     Objective:    BP 130/78 (BP Location: Right Arm, Patient Position: Sitting, Cuff Size: Normal)   Pulse (!) 59   Temp 97.7 F (36.5 C)   Ht 5\' 3"  (1.6 m)   Wt 201 lb 4 oz (91.3 kg)   SpO2 96%   BMI 35.65 kg/m   Wt Readings from Last 3 Encounters:  11/08/17 201 lb 4 oz (91.3 kg)  07/19/17 205 lb (93 kg)  04/20/17 208 lb (94.3 kg)    Physical Exam  Constitutional: She is oriented to person, place, and time. She appears well-developed and well-nourished.  HENT:  Head: Normocephalic and atraumatic.  Neck: Neck supple.  Cardiovascular: Normal rate and regular rhythm.  Pulmonary/Chest: Effort normal and breath sounds normal.  Abdominal: Soft. Bowel sounds are normal. She exhibits no mass. There is no hepatosplenomegaly. There is no tenderness.  Musculoskeletal: She exhibits no edema.  Lymphadenopathy:    She has no cervical adenopathy.  Neurological: She is alert and oriented to person, place, and time.  Skin: Skin is warm and dry.  Psychiatric: She has a normal mood and affect. Her behavior is normal.  Vitals reviewed.   Results for orders placed or performed during the hospital encounter of 11/07/17  Lipid panel  Result Value Ref Range   Cholesterol 222 (H) 0 - 200 mg/dL   Triglycerides 409 (H) <150 mg/dL   HDL 30 (L) >81 mg/dL   Total CHOL/HDL Ratio 7.4 RATIO   VLDL UNABLE  TO CALCULATE IF TRIGLYCERIDE OVER 400 mg/dL 0 - 40 mg/dL   LDL Cholesterol UNABLE TO CALCULATE IF TRIGLYCERIDE OVER 400 mg/dL 0 - 99 mg/dL  Comprehensive metabolic panel  Result Value Ref Range   Sodium 138 135 - 145 mmol/L   Potassium 3.9 3.5 - 5.1 mmol/L   Chloride 112 (H) 98 - 111 mmol/L   CO2 21 (L) 22 - 32 mmol/L   Glucose, Bld 103 (H) 70 - 99 mg/dL   BUN 17 6 - 20 mg/dL   Creatinine, Ser 1.610.69  0.44 - 1.00 mg/dL   Calcium 9.1 8.9 - 09.610.3 mg/dL   Total Protein 6.9 6.5 - 8.1 g/dL   Albumin 4.0 3.5 - 5.0 g/dL   AST 21 15 - 41 U/L   ALT 24 0 - 44 U/L   Alkaline Phosphatase 63 38 - 126 U/L   Total Bilirubin 0.9 0.3 - 1.2 mg/dL   GFR calc non Af Amer >60 >60 mL/min   GFR calc Af Amer >60 >60 mL/min   Anion gap 5 5 - 15      Assessment & Plan:   Encounter Diagnoses  Name Primary?  . Hypertriglyceridemia Yes  . Mammogram declined      -reviewed labs with pt -pt doesn't know that she will take any medication but agrees to take a prescription for fenofibrate with her.  Counseled pt again on risks of hypertriglyeridemia and gave her reading information on same from the [employer] -pt declines mammogram -congratulated pt on stopping smoking -pt agrees to plan to update PAP at next OV in 3 months.  Pt to RTO sooner prn

## 2018-02-08 ENCOUNTER — Ambulatory Visit: Payer: Self-pay | Admitting: Physician Assistant

## 2018-03-08 ENCOUNTER — Other Ambulatory Visit (HOSPITAL_COMMUNITY)
Admission: RE | Admit: 2018-03-08 | Discharge: 2018-03-08 | Disposition: A | Discharge status: Home / Self Care | Payer: Self-pay | Source: Ambulatory Visit | Attending: Physician Assistant | Admitting: Physician Assistant

## 2018-03-08 ENCOUNTER — Ambulatory Visit: Payer: Self-pay | Admitting: Physician Assistant

## 2018-03-08 ENCOUNTER — Encounter: Payer: Self-pay | Admitting: Physician Assistant

## 2018-03-08 VITALS — BP 126/80 | HR 71 | Temp 97.7°F | Ht 63.0 in | Wt 200.8 lb

## 2018-03-08 DIAGNOSIS — Z124 Encounter for screening for malignant neoplasm of cervix: Secondary | ICD-10-CM | POA: Insufficient documentation

## 2018-03-08 DIAGNOSIS — E785 Hyperlipidemia, unspecified: Secondary | ICD-10-CM

## 2018-03-08 DIAGNOSIS — F17200 Nicotine dependence, unspecified, uncomplicated: Secondary | ICD-10-CM

## 2018-03-08 DIAGNOSIS — Z5320 Procedure and treatment not carried out because of patient's decision for unspecified reasons: Secondary | ICD-10-CM

## 2018-03-08 NOTE — Progress Notes (Signed)
BP 126/80 (BP Location: Left Arm, Patient Position: Sitting, Cuff Size: Normal)   Pulse 71   Temp 97.7 F (36.5 C)   Ht 5\' 3"  (1.6 m)   Wt 200 lb 12 oz (91.1 kg)   LMP 02/24/2018 (Exact Date)   SpO2 98%   BMI 35.56 kg/m    Subjective:    Patient ID: Jessica MawMaricarmen Hafley, female    DOB: 02/20/1971, 47 y.o.   MRN: [id number]  HPI: Jessica Kennedy is a 47 y.o. female presenting on 03/08/2018 for Gynecologic Exam and Hyperlipidemia (pt states she was unable to get labs drawn because she owes money. pt has [facility] discount application and is working on turning it in.)   HPI   Chief Complaint  Patient presents with  . Gynecologic Exam  . Hyperlipidemia    pt states she was unable to get labs drawn because she owes money. pt has [facility] discount application and is working on turning it in.    LMP 02/22/18  Pt is started smoking again  Pt Declines mammogarm  She says she is doing well and has no complaints.  Relevant past medical, surgical, family and social history reviewed and updated as indicated. Interim medical history since our last visit reviewed. Allergies and medications reviewed and updated.   Current Outpatient Medications:  .  fenofibrate (TRICOR) 145 MG tablet, Take 1 tablet (145 mg total) by mouth daily. (Patient not taking: Reported on 03/08/2018), Disp: 30 tablet, Rfl: 4   Review of Systems  Constitutional: Positive for appetite change and fatigue. Negative for chills, diaphoresis, fever and unexpected weight change.  HENT: Negative for congestion, dental problem, drooling, ear pain, facial swelling, hearing loss, mouth sores, sneezing, sore throat, trouble swallowing and voice change.   Eyes: Positive for visual disturbance. Negative for pain, discharge, redness and itching.  Respiratory: Negative for cough, choking, shortness of breath and wheezing.   Cardiovascular: Negative for chest pain, palpitations and leg swelling.  Gastrointestinal: Negative for abdominal  pain, blood in stool, constipation, diarrhea and vomiting.  Endocrine: Negative for cold intolerance, heat intolerance and polydipsia.  Genitourinary: Negative for decreased urine volume, dysuria and hematuria.  Musculoskeletal: Negative for arthralgias, back pain and gait problem.  Skin: Negative for rash.  Allergic/Immunologic: Negative for environmental allergies.  Neurological: Negative for seizures, syncope, light-headedness and headaches.  Hematological: Negative for adenopathy.  Psychiatric/Behavioral: Positive for dysphoric mood. Negative for agitation and suicidal ideas. The patient is not nervous/anxious.     Per HPI unless specifically indicated above     Objective:    BP 126/80 (BP Location: Left Arm, Patient Position: Sitting, Cuff Size: Normal)   Pulse 71   Temp 97.7 F (36.5 C)   Ht 5\' 3"  (1.6 m)   Wt 200 lb 12 oz (91.1 kg)   LMP 02/24/2018 (Exact Date)   SpO2 98%   BMI 35.56 kg/m   Wt Readings from Last 3 Encounters:  03/08/18 200 lb 12 oz (91.1 kg)  11/08/17 201 lb 4 oz (91.3 kg)  07/19/17 205 lb (93 kg)    Physical Exam  Constitutional: She is oriented to person, place, and time. She appears well-developed and well-nourished.  Pulmonary/Chest: Effort normal. No breast tenderness, discharge or bleeding.  Breast exam normal  Abdominal: Soft. She exhibits no mass. There is no tenderness. There is no rebound and no guarding.  Genitourinary: Vagina normal and uterus normal. No breast tenderness, discharge or bleeding. There is no rash, tenderness or lesion on the right labia. There  is no rash, tenderness or lesion on the left labia. Cervix exhibits no motion tenderness, no discharge and no friability. Right adnexum displays no mass, no tenderness and no fullness. Left adnexum displays no mass, no tenderness and no fullness.  Genitourinary Comments: (nurse Berenice assisted)  Neurological: She is alert and oriented to person, place, and time.  Skin: Skin is warm and  dry.  Psychiatric: She has a normal mood and affect. Her behavior is normal.  Nursing note and vitals reviewed.       Assessment & Plan:   Encounter Diagnoses  Name Primary?  . Routine Papanicolaou smear Yes  . Mammogram declined   . Hyperlipidemia, unspecified hyperlipidemia type   . Tobacco use disorder      -pt counseled to get labs drawn -she is [facility] application [facility] to help with the bill she got for her labwork -counseled smoking cessation -pt to follow up 3 months.  RTO sooner prn

## 2018-06-07 ENCOUNTER — Ambulatory Visit: Payer: Self-pay | Admitting: Physician Assistant

## 2018-06-07 ENCOUNTER — Encounter: Payer: Self-pay | Admitting: Physician Assistant

## 2018-06-07 VITALS — BP 134/72 | HR 90 | Temp 98.4°F | Ht 63.0 in | Wt 197.0 lb

## 2018-06-07 DIAGNOSIS — K219 Gastro-esophageal reflux disease without esophagitis: Secondary | ICD-10-CM

## 2018-06-07 DIAGNOSIS — Z5320 Procedure and treatment not carried out because of patient's decision for unspecified reasons: Secondary | ICD-10-CM

## 2018-06-07 DIAGNOSIS — E669 Obesity, unspecified: Secondary | ICD-10-CM

## 2018-06-07 DIAGNOSIS — E785 Hyperlipidemia, unspecified: Secondary | ICD-10-CM

## 2018-06-07 DIAGNOSIS — F17200 Nicotine dependence, unspecified, uncomplicated: Secondary | ICD-10-CM

## 2018-06-07 NOTE — Progress Notes (Signed)
BP 134/72 (BP Location: Left Arm, Patient Position: Sitting, Cuff Size: Normal)   Pulse 90   Temp 98.4 F (36.9 C) (Other (Comment))   Ht 5\' 3"  (1.6 m)   Wt 197 lb (89.4 kg)   SpO2 98%   BMI 34.90 kg/m    Subjective:    Patient ID: Jessica Kennedy, female    DOB: 12-26-1970, 48 y.o.   MRN: [id number]  HPI: Jessica Kennedy is a 48 y.o. female presenting on 06/07/2018 for Hyperlipidemia   HPI   Pt is doing well and has no complaints today except for some occasional burning.  She admits to eating a lot of spicy foods as well as eating late in the evening.  She is still smoking.  She is not taking any medications for her cholesterol at this time as she has not been getting her labs done and has chosen not to at this point.   Relevant past medical, surgical, family and social history reviewed and updated as indicated. Interim medical history since our last visit reviewed. Allergies and medications reviewed and updated.  No current outpatient medications on file.    Review of Systems  Constitutional: Negative for appetite change, chills, diaphoresis, fatigue, fever and unexpected weight change.  HENT: Positive for dental problem. Negative for congestion, drooling, ear pain, facial swelling, hearing loss, mouth sores, sneezing, sore throat, trouble swallowing and voice change.   Eyes: Positive for visual disturbance. Negative for pain, discharge, redness and itching.  Respiratory: Negative for cough, choking, shortness of breath and wheezing.   Cardiovascular: Negative for chest pain, palpitations and leg swelling.  Gastrointestinal: Negative for abdominal pain, blood in stool, constipation, diarrhea and vomiting.  Endocrine: Negative for cold intolerance, heat intolerance and polydipsia.  Genitourinary: Negative for decreased urine volume, dysuria and hematuria.  Musculoskeletal: Negative for arthralgias, back pain and gait problem.  Skin: Negative for rash.   Allergic/Immunologic: Negative for environmental allergies.  Neurological: Negative for seizures, syncope, light-headedness and headaches.  Hematological: Negative for adenopathy.  Psychiatric/Behavioral: Negative for agitation, dysphoric mood and suicidal ideas. The patient is not nervous/anxious.     Per HPI unless specifically indicated above     Objective:    BP 134/72 (BP Location: Left Arm, Patient Position: Sitting, Cuff Size: Normal)   Pulse 90   Temp 98.4 F (36.9 C) (Other (Comment))   Ht 5\' 3"  (1.6 m)   Wt 197 lb (89.4 kg)   SpO2 98%   BMI 34.90 kg/m   Wt Readings from Last 3 Encounters:  06/07/18 197 lb (89.4 kg)  03/08/18 200 lb 12 oz (91.1 kg)  11/08/17 201 lb 4 oz (91.3 kg)    Physical Exam Vitals signs reviewed.  Constitutional:      Appearance: She is well-developed.  HENT:     Head: Normocephalic and atraumatic.  Neck:     Musculoskeletal: Neck supple.  Cardiovascular:     Rate and Rhythm: Normal rate and regular rhythm.  Pulmonary:     Effort: Pulmonary effort is normal.     Breath sounds: Normal breath sounds.  Abdominal:     General: Bowel sounds are normal.     Palpations: Abdomen is soft. There is no mass.     Tenderness: There is no abdominal tenderness.  Lymphadenopathy:     Cervical: No cervical adenopathy.  Skin:    General: Skin is warm and dry.  Neurological:     Mental Status: She is alert and oriented to person, place, and time.  Psychiatric:        Behavior: Behavior normal.         Assessment & Plan:    Encounter Diagnoses  Name Primary?  . Hyperlipidemia, unspecified hyperlipidemia type Yes  . Mammogram declined   . Tobacco use disorder   . Obesity, unspecified classification, unspecified obesity type, unspecified whether serious comorbidity present   . Gastroesophageal reflux disease, esophagitis presence not specified     -pt Declined screening  mammogram -pt to Get fasting labs.  Will call with  results -counseled on smoking cessation -counseled on lifestyle changes for GERD.  She can use OTC prn -pt to follow up 3 months.  RTO sooner prn

## 2018-06-07 NOTE — Patient Instructions (Signed)
Gastroesophageal Reflux Disease, Adult Gastroesophageal reflux (GER) happens when acid from the stomach flows up into the tube that connects the mouth and the stomach (esophagus). Normally, food travels down the esophagus and stays in the stomach to be digested. However, when a person has GER, food and stomach acid sometimes move back up into the esophagus. If this becomes a more serious problem, the person may be diagnosed with a disease called gastroesophageal reflux disease (GERD). GERD occurs when the reflux:  Happens often.  Causes frequent or severe symptoms.  Causes problems such as damage to the esophagus. When stomach acid comes in contact with the esophagus, the acid may cause soreness (inflammation) in the esophagus. Over time, GERD may create small holes (ulcers) in the lining of the esophagus. What are the causes? This condition is caused by a problem with the muscle between the esophagus and the stomach (lower esophageal sphincter, or LES). Normally, the LES muscle closes after food passes through the esophagus to the stomach. When the LES is weakened or abnormal, it does not close properly, and that allows food and stomach acid to go back up into the esophagus. The LES can be weakened by certain dietary substances, medicines, and medical conditions, including:  Tobacco use.  Pregnancy.  Having a hiatal hernia.  Alcohol use.  Certain foods and beverages, such as coffee, chocolate, onions, and peppermint. What increases the risk? You are more likely to develop this condition if you:  Have an increased body weight.  Have a connective tissue disorder.  Use NSAID medicines. What are the signs or symptoms? Symptoms of this condition include:  Heartburn.  Difficult or painful swallowing.  The feeling of having a lump in the throat.  Abitter taste in the mouth.  Bad breath.  Having a large amount of saliva.  Having an upset or bloated  stomach.  Belching.  Chest pain. Different conditions can cause chest pain. Make sure you see your health care provider if you experience chest pain.  Shortness of breath or wheezing.  Ongoing (chronic) cough or a night-time cough.  Wearing away of tooth enamel.  Weight loss. How is this diagnosed? Your health care provider will take a medical history and perform a physical exam. To determine if you have mild or severe GERD, your health care provider may also monitor how you respond to treatment. You may also have tests, including:  A test to examine your stomach and esophagus with a small camera (endoscopy).  A test thatmeasures the acidity level in your esophagus.  A test thatmeasures how much pressure is on your esophagus.  A barium swallow or modified barium swallow test to show the shape, size, and functioning of your esophagus. How is this treated? The goal of treatment is to help relieve your symptoms and to prevent complications. Treatment for this condition may vary depending on how severe your symptoms are. Your health care provider may recommend:  Changes to your diet.  Medicine.  Surgery. Follow these instructions at home: Eating and drinking   Follow a diet as recommended by your health care provider. This may involve avoiding foods and drinks such as: ? Coffee and tea (with or without caffeine). ? Drinks that containalcohol. ? Energy drinks and sports drinks. ? Carbonated drinks or sodas. ? Chocolate and cocoa. ? Peppermint and mint flavorings. ? Garlic and onions. ? Horseradish. ? Spicy and acidic foods, including peppers, chili powder, curry powder, vinegar, hot sauces, and barbecue sauce. ? Citrus fruit juices and citrus   fruits, such as oranges, lemons, and limes. ? Tomato-based foods, such as red sauce, chili, salsa, and pizza with red sauce. ? Fried and fatty foods, such as donuts, french fries, potato chips, and high-fat dressings. ? High-fat  meats, such as hot dogs and fatty cuts of red and white meats, such as rib eye steak, sausage, ham, and bacon. ? High-fat dairy items, such as whole milk, butter, and cream cheese.  Eat small, frequent meals instead of large meals.  Avoid drinking large amounts of liquid with your meals.  Avoid eating meals during the 2-3 hours before bedtime.  Avoid lying down right after you eat.  Do not exercise right after you eat. Lifestyle   Do not use any products that contain nicotine or tobacco, such as cigarettes, e-cigarettes, and chewing tobacco. If you need help quitting, ask your health care provider.  Try to reduce your stress by using methods such as yoga or meditation. If you need help reducing stress, ask your health care provider.  If you are overweight, reduce your weight to an amount that is healthy for you. Ask your health care provider for guidance about a safe weight loss goal. General instructions  Pay attention to any changes in your symptoms.  Take over-the-counter and prescription medicines only as told by your health care provider. Do not take aspirin, ibuprofen, or other NSAIDs unless your health care provider told you to do so.  Wear loose-fitting clothing. Do not wear anything tight around your waist that causes pressure on your abdomen.  Raise (elevate) the head of your bed about 6 inches (15 cm).  Avoid bending over if this makes your symptoms worse.  Keep all follow-up visits as told by your health care provider. This is important. Contact a health care provider if:  You have: ? New symptoms. ? Unexplained weight loss. ? Difficulty swallowing or it hurts to swallow. ? Wheezing or a persistent cough. ? A hoarse voice.  Your symptoms do not improve with treatment. Get help right away if you:  Have pain in your arms, neck, jaw, teeth, or back.  Feel sweaty, dizzy, or light-headed.  Have chest pain or shortness of breath.  Vomit and your vomit looks  like blood or coffee grounds.  Faint.  Have stool that is bloody or black.  Cannot swallow, drink, or eat. Summary  Gastroesophageal reflux happens when acid from the stomach flows up into the esophagus. GERD is a disease in which the reflux happens often, causes frequent or severe symptoms, or causes problems such as damage to the esophagus.  Treatment for this condition may vary depending on how severe your symptoms are. Your health care provider may recommend diet and lifestyle changes, medicine, or surgery.  Contact a health care provider if you have new or worsening symptoms.  Take over-the-counter and prescription medicines only as told by your health care provider. Do not take aspirin, ibuprofen, or other NSAIDs unless your health care provider told you to do so.  Keep all follow-up visits as told by your health care provider. This is important. This information is not intended to replace advice given to you by your health care provider. Make sure you discuss any questions you have with your health care provider. Document Released: 12/30/2004 Document Revised: 09/28/2017 Document Reviewed: 09/28/2017 [employer] Interactive Patient Education  2019 [employer]  

## 2018-09-06 ENCOUNTER — Ambulatory Visit: Payer: Self-pay | Admitting: Physician Assistant

## 2019-03-14 ENCOUNTER — Other Ambulatory Visit: Payer: Self-pay | Admitting: *Deleted

## 2019-03-14 DIAGNOSIS — Z20822 Contact with and (suspected) exposure to covid-19: Secondary | ICD-10-CM

## 2019-03-16 LAB — NOVEL CORONAVIRUS, NAA: SARS-CoV-2, NAA: NOT DETECTED

## 2020-03-12 ENCOUNTER — Ambulatory Visit: Payer: Self-pay | Admitting: Physician Assistant

## 2020-03-12 ENCOUNTER — Ambulatory Visit (HOSPITAL_COMMUNITY)
Admission: RE | Admit: 2020-03-12 | Discharge: 2020-03-12 | Disposition: A | Discharge status: Home / Self Care | Payer: Self-pay | Source: Ambulatory Visit | Attending: Physician Assistant | Admitting: Physician Assistant

## 2020-03-12 ENCOUNTER — Other Ambulatory Visit (HOSPITAL_COMMUNITY)
Admission: RE | Admit: 2020-03-12 | Discharge: 2020-03-12 | Disposition: A | Discharge status: Home / Self Care | Payer: Self-pay | Source: Intra-hospital | Attending: Physician Assistant | Admitting: Physician Assistant

## 2020-03-12 ENCOUNTER — Encounter: Payer: Self-pay | Admitting: Physician Assistant

## 2020-03-12 ENCOUNTER — Other Ambulatory Visit: Payer: Self-pay

## 2020-03-12 VITALS — BP 135/78 | HR 66 | Temp 96.2°F | Ht 64.5 in | Wt 189.0 lb

## 2020-03-12 DIAGNOSIS — M79645 Pain in left finger(s): Secondary | ICD-10-CM

## 2020-03-12 DIAGNOSIS — L84 Corns and callosities: Secondary | ICD-10-CM

## 2020-03-12 DIAGNOSIS — Z131 Encounter for screening for diabetes mellitus: Secondary | ICD-10-CM | POA: Insufficient documentation

## 2020-03-12 DIAGNOSIS — E785 Hyperlipidemia, unspecified: Secondary | ICD-10-CM

## 2020-03-12 DIAGNOSIS — E669 Obesity, unspecified: Secondary | ICD-10-CM

## 2020-03-12 DIAGNOSIS — Z7689 Persons encountering health services in other specified circumstances: Secondary | ICD-10-CM

## 2020-03-12 DIAGNOSIS — Z5320 Procedure and treatment not carried out because of patient's decision for unspecified reasons: Secondary | ICD-10-CM

## 2020-03-12 LAB — COMPREHENSIVE METABOLIC PANEL
ALT: 22 U/L (ref 0–44)
AST: 21 U/L (ref 15–41)
Albumin: 4.2 g/dL (ref 3.5–5.0)
Alkaline Phosphatase: 63 U/L (ref 38–126)
Anion gap: 9 (ref 5–15)
BUN: 13 mg/dL (ref 6–20)
CO2: 21 mmol/L — ABNORMAL LOW (ref 22–32)
Calcium: 9.4 mg/dL (ref 8.9–10.3)
Chloride: 105 mmol/L (ref 98–111)
Creatinine, Ser: 0.67 mg/dL (ref 0.44–1.00)
GFR, Estimated: >60 mL/min (ref >60)
Glucose, Bld: 101 mg/dL — ABNORMAL HIGH (ref 70–99)
Potassium: 3.9 mmol/L (ref 3.5–5.1)
Sodium: 135 mmol/L (ref 135–145)
Total Bilirubin: 0.9 mg/dL (ref 0.3–1.2)
Total Protein: 7.3 g/dL (ref 6.5–8.1)

## 2020-03-12 LAB — LIPID PANEL
Cholesterol: 257 mg/dL — ABNORMAL HIGH (ref 0–200)
HDL: 43 mg/dL (ref >40)
LDL Cholesterol: 144 mg/dL — ABNORMAL HIGH (ref 0–99)
Total CHOL/HDL Ratio: 6 RATIO
Triglycerides: 351 mg/dL — ABNORMAL HIGH (ref <150)
VLDL: 70 mg/dL — ABNORMAL HIGH (ref 0–40)

## 2020-03-12 LAB — HEMOGLOBIN A1C
Hgb A1c MFr Bld: 5.2 % (ref 4.8–5.6)
Mean Plasma Glucose: 102.54 mg/dL
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
## 2020-03-12 NOTE — Patient Instructions (Addendum)
[email or web address]   [email or web address]   -----------------------------------------   DASH Eating Plan DASH stands for "Dietary Approaches to Stop Hypertension." The DASH eating plan is a healthy eating plan that has been shown to reduce high blood pressure (hypertension). It may also reduce your risk for type 2 diabetes, heart disease, and stroke. The DASH eating plan may also help with weight loss. What are tips for following this plan?  General guidelines  Avoid eating more than 2,300 mg (milligrams) of salt (sodium) a day. If you have hypertension, you may need to reduce your sodium intake to 1,500 mg a day.  Limit alcohol intake to no more than 1 drink a day for nonpregnant women and 2 drinks a day for men. One drink equals 12 oz of beer, 5 oz of wine, or 1 oz of hard liquor.  Work with your health care provider to maintain a healthy body weight or to lose weight. Ask what an ideal weight is for you.  Get at least 30 minutes of exercise that causes your heart to beat faster (aerobic exercise) most days of the week. Activities may include walking, swimming, or biking.  Work with your health care provider or diet and nutrition specialist (dietitian) to adjust your eating plan to your individual calorie needs. Reading food labels   Check food labels for the amount of sodium per serving. Choose foods with less than 5 percent of the Daily Value of sodium. Generally, foods with less than 300 mg of sodium per serving fit into this eating plan.  To find whole grains, look for the word "whole" as the first word in the ingredient list. Shopping  Buy products labeled as "low-sodium" or "no salt added."  Buy fresh foods. Avoid canned foods and premade or frozen meals. Cooking  Avoid adding salt when cooking. Use salt-free seasonings or herbs instead of table salt or sea salt. Check with your health care provider or pharmacist before using salt substitutes.  Do not fry foods. Cook  foods using healthy methods such as baking, boiling, grilling, and broiling instead.  Cook with heart-healthy oils, such as olive, canola, soybean, or sunflower oil. Meal planning  Eat a balanced diet that includes: ? 5 or more servings of fruits and vegetables each day. At each meal, try to fill half of your plate with fruits and vegetables. ? Up to 6-8 servings of whole grains each day. ? Less than 6 oz of lean meat, poultry, or fish each day. A 3-oz serving of meat is about the same size as a deck of cards. One egg equals 1 oz. ? 2 servings of low-fat dairy each day. ? A serving of nuts, seeds, or beans 5 times each week. ? Heart-healthy fats. Healthy fats called Omega-3 fatty acids are found in foods such as flaxseeds and coldwater fish, like sardines, salmon, and mackerel.  Limit how much you eat of the following: ? Canned or prepackaged foods. ? Food that is high in trans fat, such as fried foods. ? Food that is high in saturated fat, such as fatty meat. ? Sweets, desserts, sugary drinks, and other foods with added sugar. ? Full-fat dairy products.  Do not salt foods before eating.  Try to eat at least 2 vegetarian meals each week.  Eat more home-cooked food and less restaurant, buffet, and fast food.  When eating at a restaurant, ask that your food be prepared with less salt or no salt, if possible. What foods are recommended? The items listed may not  be a complete list. Talk with your dietitian about what dietary choices are best for you. Grains Whole-grain or whole-wheat bread. Whole-grain or whole-wheat pasta. Brown rice. [place]row. Bulgur. Whole-grain and low-sodium cereals. Pita bread. Low-fat, low-sodium crackers. Whole-wheat flour tortillas. Vegetables Fresh or frozen vegetables (raw, steamed, roasted, or grilled). Low-sodium or reduced-sodium tomato and vegetable juice. Low-sodium or reduced-sodium tomato sauce and tomato paste. Low-sodium or reduced-sodium canned  vegetables. Fruits All fresh, dried, or frozen fruit. Canned fruit in natural juice (without added sugar). Meat and other protein foods Skinless chicken or [place]. Ground chicken or [place]. Pork with fat trimmed off. Fish and seafood. Egg whites. Dried beans, peas, or lentils. Unsalted nuts, nut butters, and seeds. Unsalted canned beans. Lean cuts of beef with fat trimmed off. Low-sodium, lean deli meat. Dairy Low-fat (1%) or fat-free (skim) milk. Fat-free, low-fat, or reduced-fat cheeses. Nonfat, low-sodium ricotta or cottage cheese. Low-fat or nonfat yogurt. Low-fat, low-sodium cheese. Fats and oils Soft margarine without trans fats. Vegetable oil. Low-fat, reduced-fat, or light mayonnaise and salad dressings (reduced-sodium). Canola, safflower, olive, soybean, and sunflower oils. Avocado. Seasoning and other foods Herbs. Spices. Seasoning mixes without salt. Unsalted popcorn and pretzels. Fat-free sweets. What foods are not recommended? The items listed may not be a complete list. Talk with your dietitian about what dietary choices are best for you. Grains Baked goods made with fat, such as croissants, muffins, or some breads. Dry pasta or rice meal packs. Vegetables Creamed or fried vegetables. Vegetables in a cheese sauce. Regular canned vegetables (not low-sodium or reduced-sodium). Regular canned tomato sauce and paste (not low-sodium or reduced-sodium). Regular tomato and vegetable juice (not low-sodium or reduced-sodium). Angie Fava. Olives. Fruits Canned fruit in a light or heavy syrup. Fried fruit. Fruit in cream or butter sauce. Meat and other protein foods Fatty cuts of meat. Ribs. Fried meat. Berniece Salines. Sausage. Bologna and other processed lunch meats. Salami. Fatback. Hotdogs. Bratwurst. Salted nuts and seeds. Canned beans with added salt. Canned or smoked fish. Whole eggs or egg yolks. Chicken or [place] with skin. Dairy Whole or 2% milk, cream, and half-and-half. Whole or full-fat  cream cheese. Whole-fat or sweetened yogurt. Full-fat cheese. Nondairy creamers. Whipped toppings. Processed cheese and cheese spreads. Fats and oils Butter. Stick margarine. Lard. Shortening. Ghee. Bacon fat. Tropical oils, such as coconut, palm kernel, or palm oil. Seasoning and other foods Salted popcorn and pretzels. Onion salt, garlic salt, seasoned salt, table salt, and sea salt. Worcestershire sauce. Tartar sauce. Barbecue sauce. Teriyaki sauce. Soy sauce, including reduced-sodium. Steak sauce. Canned and packaged gravies. Fish sauce. Oyster sauce. Cocktail sauce. Horseradish that you find on the shelf. Ketchup. Mustard. Meat flavorings and tenderizers. Bouillon cubes. Hot sauce and Tabasco sauce. Premade or packaged marinades. Premade or packaged taco seasonings. Relishes. Regular salad dressings. Where to find more information:  National Heart, Lung, and [place]: https://wilson-eaton.com/  American Heart Association: www.heart.org Summary  The DASH eating plan is a healthy eating plan that has been shown to reduce high blood pressure (hypertension). It may also reduce your risk for type 2 diabetes, heart disease, and stroke.  With the DASH eating plan, you should limit salt (sodium) intake to 2,300 mg a day. If you have hypertension, you may need to reduce your sodium intake to 1,500 mg a day.  When on the DASH eating plan, aim to eat more fresh fruits and vegetables, whole grains, lean proteins, low-fat dairy, and heart-healthy fats.  Work with your health care provider or diet and nutrition  specialist (dietitian) to adjust your eating plan to your individual calorie needs. This information is not intended to replace advice given to you by your health care provider. Make sure you discuss any questions you have with your health care provider. Document Revised: 03/04/2017 Document Reviewed: 03/15/2016 Elsevier Patient Education  2020 [employer].

## 2020-03-13 ENCOUNTER — Ambulatory Visit: Payer: Self-pay | Admitting: Physician Assistant

## 2020-03-13 ENCOUNTER — Encounter: Payer: Self-pay | Admitting: Physician Assistant

## 2020-03-13 VITALS — BP 116/60 | HR 73 | Temp 93.5°F | Ht 64.5 in | Wt 190.8 lb

## 2020-03-13 DIAGNOSIS — M79676 Pain in unspecified toe(s): Secondary | ICD-10-CM

## 2020-03-13 DIAGNOSIS — Z5320 Procedure and treatment not carried out because of patient's decision for unspecified reasons: Secondary | ICD-10-CM

## 2020-03-13 DIAGNOSIS — E785 Hyperlipidemia, unspecified: Secondary | ICD-10-CM

## 2020-03-13 DIAGNOSIS — E669 Obesity, unspecified: Secondary | ICD-10-CM

## 2020-03-13 DIAGNOSIS — L989 Disorder of the skin and subcutaneous tissue, unspecified: Secondary | ICD-10-CM

## 2020-03-13 DIAGNOSIS — M79645 Pain in left finger(s): Secondary | ICD-10-CM

## 2020-03-13 MED ORDER — SIMVASTATIN 20 MG PO TABS: 20.0000 mg | ORAL_TABLET | Freq: Every day | ORAL | 4 refills | Status: DC

## 2020-03-13 MED ORDER — PA FISH OIL/KRILL PO CAPS: 2.0000 | ORAL_CAPSULE | Freq: Every day | ORAL | Status: DC

## 2020-03-13 NOTE — Progress Notes (Signed)
BP 116/60    Pulse 73    Temp (!) 93.5 F (34.2 C)    Ht 5' 4.5" (1.638 m)    Wt 190 lb 12 oz (86.5 kg)    LMP 02/20/2020    SpO2 99%    BMI 32.24 kg/m    Subjective:    Patient ID: Jessica Kennedy, female    DOB: 06/15/1970, 49 y.o.   MRN: [id number]  HPI: Jessica Kennedy is a 49 y.o. female presenting on 03/13/2020 for Results (To review labs) and Finger problem (mass of skin on L middle finger. Pt states it is painful. Pt has had it for about 2 months.)   HPI    Pt had a negative covid 19 screening questionnaire.    Chief Complaint  Patient presents with   Results    To review labs   Finger problem    mass of skin on L middle finger. Pt states it is painful. Pt has had it for about 2 months.     She applied yesterday for FP medicad for to get STD testing  She is on dental list.  Pt seen yesterday in office.  She was sent for xray of the finger to see if any FB were visible.  Discussed xray with pt; no FB seen.  She also has the place on her nose/face that she is concerned about as discussed yesterday.     Relevant past medical, surgical, family and social history reviewed and updated as indicated. Interim medical history since our last visit reviewed. Allergies and medications reviewed and updated.  No current outpatient medications on file.     Review of Systems  Per HPI unless specifically indicated above     Objective:    BP 116/60    Pulse 73    Temp (!) 93.5 F (34.2 C)    Ht 5' 4.5" (1.638 m)    Wt 190 lb 12 oz (86.5 kg)    LMP 02/20/2020    SpO2 99%    BMI 32.24 kg/m   Wt Readings from Last 3 Encounters:  03/13/20 190 lb 12 oz (86.5 kg)  03/12/20 189 lb (85.7 kg)  06/07/18 197 lb (89.4 kg)    Physical Exam Constitutional:      General: She is not in acute distress.    Appearance: She is not ill-appearing.  HENT:     Head: Normocephalic and atraumatic.  Pulmonary:     Effort: Pulmonary effort is normal. No respiratory distress.   Musculoskeletal:     Comments: L 3rd finger with thickened hard tissue on distal phalanx next to but not including the nail.   R th toe with thickened hard tissue on dorsal surface  Skin:    Comments: approx 58mm lesion on face next to nose with multiple colorings.  Flat but with evidence of excoriation.    Neurological:     Mental Status: She is alert and oriented to person, place, and time.  Psychiatric:        Behavior: Behavior normal.     Results for orders placed or performed during the hospital encounter of 03/12/20  Hemoglobin A1c  Result Value Ref Range   Hgb A1c MFr Bld 5.2 4.8 - 5.6 %   Mean Plasma Glucose 102.54 mg/dL  Lipid panel  Result Value Ref Range   Cholesterol 257 (H) 0 - 200 mg/dL   Triglycerides 485 (H) <150 mg/dL   HDL 43 >46 mg/dL   Total CHOL/HDL Ratio 6.0  RATIO   VLDL 70 (H) 0 - 40 mg/dL   LDL Cholesterol 025 (H) 0 - 99 mg/dL  Comprehensive metabolic panel  Result Value Ref Range   Sodium 135 135 - 145 mmol/L   Potassium 3.9 3.5 - 5.1 mmol/L   Chloride 105 98 - 111 mmol/L   CO2 21 (L) 22 - 32 mmol/L   Glucose, Bld 101 (H) 70 - 99 mg/dL   BUN 13 6 - 20 mg/dL   Creatinine, Ser 4.27 0.44 - 1.00 mg/dL   Calcium 9.4 8.9 - 06.2 mg/dL   Total Protein 7.3 6.5 - 8.1 g/dL   Albumin 4.2 3.5 - 5.0 g/dL   AST 21 15 - 41 U/L   ALT 22 0 - 44 U/L   Alkaline Phosphatase 63 38 - 126 U/L   Total Bilirubin 0.9 0.3 - 1.2 mg/dL   GFR, Estimated >37 >62 mL/min   Anion gap 9 5 - 15      Assessment & Plan:   Encounter Diagnoses  Name Primary?   Hyperlipidemia, unspecified hyperlipidemia type Yes   Screening mammography declined    Obesity, unspecified classification, unspecified obesity type, unspecified whether serious comorbidity present    Pain of left middle finger    Pain of fifth toe    Skin lesion of face       -reviewed labs with pt  -pt will start Fish oil and statin for the lipids.  She is also counseled on lowfat diet.  -will refer pt  to dermatology to look at place on nose.  Will also ask them look to see if finger and toe look like warts or a corn (on the toe) or what   -pt to get her STD testing at [facility] or gyn of her choice  -pt declines mammogram at this time but willl think about getting it in the future  -pt to follow up 3 months.  She is to contact office sooner prn

## 2020-03-13 NOTE — Patient Instructions (Signed)
Warts ° °Warts are small growths on the skin. They are common and can occur on many areas of the body. A person may have one wart or several warts. °In many cases, warts do not require treatment. They usually go away on their own over a period of many months to a few years. If needed, warts that cause problems or do not go away on their own can be treated. °What are the causes? °Warts are caused by a type of virus that is called human papillomavirus (HPV). °· This virus can spread from person to person through direct contact. °· Warts can also spread to other areas of the body when a person scratches a wart and then scratches another area of his or her body. °What increases the risk? °You are more likely to develop this condition if: °· You are 10-20 years old. °· You have a weakened body defense system (immune system). °· You are Caucasian. °What are the signs or symptoms? °The main symptom of this condition is small growths on the skin. Warts may: °· Be round or oval or have an irregular shape. °· Have a rough surface. °· Range in color from skin color to light yellow, brown, or gray. °· Generally be less than ½ inch (1.3 cm) in size. °· Go away and then come back again. °Most warts are painless, but some can be painful if they are large or occur in an area of the body where pressure will be applied to them, such as the bottom of the foot. °How is this diagnosed? °A wart can usually be diagnosed based on its appearance. In some cases, a tissue sample may be removed (biopsy) to be looked at under a microscope. °How is this treated? °In many cases, warts do not need treatment. Sometimes treatment is desired. If treatment is needed or desired, options may include: °· Applying medicated solutions, creams, or patches to the wart. These may be over-the-counter or prescription medicines that make the skin soft so that layers will gradually shed away. In many cases, the medicine is applied one or two times per day and  covered with a bandage. °· Putting duct tape over the top of the wart (occlusion). You will leave the tape in place for as long as told by your health care provider and then replace it with a new strip of tape. This is done until the wart goes away. °· Freezing the wart with liquid nitrogen (cryotherapy). °· Burning the wart with: °? Laser treatment. °? An electrified probe (electrocautery). °· Injection of a medicine (Candida antigen) into the wart to help the body's immune system fight off the wart. °· Surgery to remove the wart. °Follow these instructions at home: °Medicines °· Apply over-the-counter and prescription medicines only as told by your health care provider. °· Do not apply over-the-counter wart medicines to your face or genitals unless your health care provider tells you to do that. °Lifestyle °· Keep your immune system healthy. To do this: °? Eat a healthy, balanced diet. °? Get enough sleep. °? Do not use any products that contain nicotine or tobacco, such as cigarettes and e-cigarettes. If you need help quitting, ask your health care provider. °General instructions ° °· Wash your hands after you touch a wart. °· Do not scratch or pick at a wart. °· Avoid shaving hair that is over a wart. °· Keep all follow-up visits as told by your health care provider. This is important. °Contact a health care provider if: °·   Your warts do not improve after treatment. °· You have redness, swelling, or pain at the site of a wart. °· You have bleeding from a wart that does not stop with light pressure. °· You have diabetes and you develop a wart. °Summary °· Warts are small growths on the skin. They are common and can occur on many areas of the body. °· In many cases, warts do not need treatment. Sometimes treatment is desired. If treatment is needed or desired, there are several treatment options. °· Apply over-the-counter and prescription medicines only as told by your health care provider. °· Wash your hands  after you touch a wart. °· Keep all follow-up visits as told by your health care provider. This is important. °This information is not intended to replace advice given to you by your health care provider. Make sure you discuss any questions you have with your health care provider. °Document Revised: 08/09/2017 Document Reviewed: 08/09/2017 °Elsevier Patient Education © 2020 Elsevier Inc. ° °

## 2020-06-11 ENCOUNTER — Other Ambulatory Visit: Payer: Self-pay | Admitting: Physician Assistant

## 2020-06-11 DIAGNOSIS — E785 Hyperlipidemia, unspecified: Secondary | ICD-10-CM

## 2020-06-24 ENCOUNTER — Ambulatory Visit: Payer: Medicaid Other | Admitting: Physician Assistant

## 2020-06-24 DIAGNOSIS — E785 Hyperlipidemia, unspecified: Secondary | ICD-10-CM

## 2020-06-24 DIAGNOSIS — Z1239 Encounter for other screening for malignant neoplasm of breast: Secondary | ICD-10-CM

## 2020-06-24 NOTE — Progress Notes (Signed)
There were no vitals taken for this visit.   Subjective:    Patient ID: Jessica Kennedy, female    DOB: 03-Sep-1970, 50 y.o.   MRN: [id number]  HPI: Jessica Kennedy is a 50 y.o. female presenting on 06/24/2020 for No chief complaint on file.   HPI    This is a telemedicine appointment through [employer] due to coronavirus pandemic.  I connected with  Jessica Kennedy on 06/24/20 by a video enabled telemedicine application and verified that I am speaking with the correct person using two identifiers.   I discussed the limitations of evaluation and management by telemedicine. The patient expressed understanding and agreed to proceed.  Pt is at home.  Provider is at office.    Pt is a 49yoF with routine appointment to follow up dyslipidemia.  She is not taking her statin but is taking her fish oil.  She did not get her labs drawn.  She says it is because she has bills and didn't want any more.   She got laid off in January.   She has not been looking for a new position but has been applying for unemployment.    She went through a time of depression after she got laid off for 2 or 3 weeks but she says she is over that now.  She says she has good knowledge about the things she needs to do in order to not be suffering depression.      Relevant past medical, surgical, family and social history reviewed and updated as indicated. Interim medical history since our last visit reviewed. Allergies and medications reviewed and updated.   Current Outpatient Medications:  .  Fish Oil-Krill Oil (PA FISH OIL/KRILL) CAPS, Take 2 capsules by mouth daily., Disp: , Rfl:  .  simvastatin (ZOCOR) 20 MG tablet, Take 1 tablet (20 mg total) by mouth at bedtime. (Patient not taking: Reported on 06/24/2020), Disp: 30 tablet, Rfl: 4    Review of Systems  Per HPI unless specifically indicated above     Objective:    There were no vitals taken for this visit.  Wt Readings from Last 3 Encounters:   03/13/20 190 lb 12 oz (86.5 kg)  03/12/20 189 lb (85.7 kg)  06/07/18 197 lb (89.4 kg)    Physical Exam Constitutional:      General: She is not in acute distress.    Appearance: She is not toxic-appearing.  HENT:     Head: Normocephalic and atraumatic.  Pulmonary:     Effort: No respiratory distress.     Comments: Pt is talking in complete sentences without sob Neurological:     Mental Status: She is alert and oriented to person, place, and time.  Psychiatric:        Attention and Perception: Attention normal.        Speech: Speech normal.        Behavior: Behavior normal. Behavior is cooperative.             Assessment & Plan:    Encounter Diagnoses  Name Primary?  . Hyperlipidemia, unspecified hyperlipidemia type Yes  . Encounter for screening for malignant neoplasm of breast, unspecified screening modality        -Will have [facility] contact re bills. -discussed mammogram again and she agrees.  Will refer for test -pt says she now has FP medicaid and will get PAP updated with that -pt says she knows resources where to get help with depression if needed -pt encouraged to  get labs drawn to check on lipids, especially in light of history of triglycerides > 1000, -pt to follow up 6 week to check on things.  It will be a virtual appointment.  She is to contact office sooner prn

## 2020-07-01 ENCOUNTER — Telehealth: Payer: Self-pay

## 2020-07-01 NOTE — Telephone Encounter (Signed)
Called pt to inform of mammogram appointment on 07/07/20 @ 12 pm. Advised pt to arrive by 11:45 am to get registered. Advised pt no deodorant, lotion, powder or perfume to breast or underarm area.

## 2020-07-07 ENCOUNTER — Other Ambulatory Visit: Payer: Self-pay

## 2020-07-07 ENCOUNTER — Ambulatory Visit (HOSPITAL_COMMUNITY)
Admission: RE | Admit: 2020-07-07 | Discharge: 2020-07-07 | Disposition: A | Discharge status: Home / Self Care | Payer: Self-pay | Source: Ambulatory Visit | Attending: Physician Assistant | Admitting: Physician Assistant

## 2020-07-07 DIAGNOSIS — Z1239 Encounter for other screening for malignant neoplasm of breast: Secondary | ICD-10-CM | POA: Insufficient documentation

## 2020-08-05 ENCOUNTER — Encounter: Payer: Self-pay | Admitting: Physician Assistant

## 2020-08-05 ENCOUNTER — Ambulatory Visit: Payer: Medicaid Other | Admitting: Physician Assistant

## 2020-08-05 ENCOUNTER — Other Ambulatory Visit: Payer: Self-pay | Admitting: Physician Assistant

## 2020-08-05 DIAGNOSIS — E785 Hyperlipidemia, unspecified: Secondary | ICD-10-CM

## 2020-08-05 NOTE — Progress Notes (Signed)
   There were no vitals taken for this visit.   Subjective:    Patient ID: Jessica Kennedy, female    DOB: July 06, 1970, 50 y.o.   MRN: [id number]  HPI: Jessica Kennedy is a 50 y.o. female presenting on 08/05/2020 for No chief complaint on file.   HPI  This is a telemedicine appointment through [employer] due to coronavirus pandemic.  I connected with  Jessica Kennedy on 08/05/20 by a video enabled telemedicine application and verified that I am speaking with the correct person using two identifiers.   I discussed the limitations of evaluation and management by telemedicine. The patient expressed understanding and agreed to proceed.  Pt is at home.  Provider is at office.    She forgot to get her blood labs drawn; they were to recheck lipids.  She spoke with [facility] about her bills  She is currently not working  She says her Mood is difficult.  Overall positive and hopeful.  She did not contact gyn to update pap.  She has no complaints today.        Relevant past medical, surgical, family and social history reviewed and updated as indicated. Interim medical history since our last visit reviewed. Allergies and medications reviewed and updated.   Current Outpatient Medications:  .  Fish Oil-Krill Oil (PA FISH OIL/KRILL) CAPS, Take 2 capsules by mouth daily., Disp: , Rfl:  .  simvastatin (ZOCOR) 20 MG tablet, Take 1 tablet (20 mg total) by mouth at bedtime. (Patient not taking: No sig reported), Disp: 30 tablet, Rfl: 4     Review of Systems  Per HPI unless specifically indicated above     Objective:    There were no vitals taken for this visit.  Wt Readings from Last 3 Encounters:  03/13/20 190 lb 12 oz (86.5 kg)  03/12/20 189 lb (85.7 kg)  06/07/18 197 lb (89.4 kg)    Physical Exam Constitutional:      General: She is not in acute distress.    Appearance: She is not toxic-appearing.  HENT:     Head: Normocephalic and atraumatic.  Pulmonary:      Effort: Pulmonary effort is normal. No respiratory distress.  Neurological:     Mental Status: She is alert and oriented to person, place, and time.  Psychiatric:        Attention and Perception: Attention normal.        Speech: Speech normal.        Behavior: Behavior normal. Behavior is cooperative.              Assessment & Plan:    Encounter Diagnosis  Name Primary?  . Hyperlipidemia, unspecified hyperlipidemia type Yes      -Pt to get fasting labs drawn.  She will be called with results -Pt to contact gyn to update PAP; she has family planning medicaid -pt to follow up  6 months.  She is to contact office sooner prn

## 2020-10-13 NOTE — Congregational Nurse Program (Signed)
Blood pressure screening at [facility]. Client is currently established with [facility] [place].She is also currently enrolled with [facility] also. Client states she's been under more stress due to change in work status and now is experiencing food insecurity. Referred also to [facility] for food. She has 2 teenage children at home.  Blood pressure today 115/80 pulse 58 Discussed stress relief such as meditation, prayer and encouraged walking each morning when it's cooler right now to help lower cholesterol and decrease stress. Client reports understanding.  Francee Nodal RN [facility]( Congregational nursing)

## 2021-02-02 ENCOUNTER — Other Ambulatory Visit (HOSPITAL_COMMUNITY)
Admission: RE | Admit: 2021-02-02 | Discharge: 2021-02-02 | Disposition: A | Discharge status: Home / Self Care | Payer: Medicaid Other | Source: Ambulatory Visit | Attending: Physician Assistant | Admitting: Physician Assistant

## 2021-02-02 DIAGNOSIS — E785 Hyperlipidemia, unspecified: Secondary | ICD-10-CM | POA: Insufficient documentation

## 2021-02-02 LAB — COMPREHENSIVE METABOLIC PANEL
ALT: 18 U/L (ref 0–44)
AST: 18 U/L (ref 15–41)
Albumin: 3.7 g/dL (ref 3.5–5.0)
Alkaline Phosphatase: 72 U/L (ref 38–126)
Anion gap: 7 (ref 5–15)
BUN: 16 mg/dL (ref 6–20)
CO2: 22 mmol/L (ref 22–32)
Calcium: 8.8 mg/dL — ABNORMAL LOW (ref 8.9–10.3)
Chloride: 107 mmol/L (ref 98–111)
Creatinine, Ser: 0.72 mg/dL (ref 0.44–1.00)
GFR, Estimated: >60 mL/min (ref >60)
Glucose, Bld: 102 mg/dL — ABNORMAL HIGH (ref 70–99)
Potassium: 4.4 mmol/L (ref 3.5–5.1)
Sodium: 136 mmol/L (ref 135–145)
Total Bilirubin: 0.5 mg/dL (ref 0.3–1.2)
Total Protein: 6.6 g/dL (ref 6.5–8.1)

## 2021-02-02 LAB — LIPID PANEL
Cholesterol: 254 mg/dL — ABNORMAL HIGH (ref 0–200)
HDL: 35 mg/dL — ABNORMAL LOW (ref >40)
LDL Cholesterol: UNDETERMINED mg/dL (ref 0–99)
Total CHOL/HDL Ratio: 7.3 RATIO
Triglycerides: 491 mg/dL — ABNORMAL HIGH (ref <150)
VLDL: UNDETERMINED mg/dL (ref 0–40)

## 2021-02-02 LAB — LDL CHOLESTEROL, DIRECT: Direct LDL: 112.4 mg/dL — ABNORMAL HIGH (ref 0–99)

## 2021-02-03 ENCOUNTER — Telehealth: Payer: Self-pay | Admitting: Licensed Clinical Social Worker

## 2021-02-03 ENCOUNTER — Other Ambulatory Visit: Payer: Self-pay

## 2021-02-03 ENCOUNTER — Ambulatory Visit: Payer: Medicaid Other | Admitting: Physician Assistant

## 2021-02-03 ENCOUNTER — Encounter: Payer: Self-pay | Admitting: Physician Assistant

## 2021-02-03 VITALS — BP 127/81 | HR 61 | Temp 98.8°F | Wt 201.0 lb

## 2021-02-03 DIAGNOSIS — Z1211 Encounter for screening for malignant neoplasm of colon: Secondary | ICD-10-CM

## 2021-02-03 DIAGNOSIS — E785 Hyperlipidemia, unspecified: Secondary | ICD-10-CM

## 2021-02-03 MED ORDER — FENOFIBRATE 145 MG PO TABS: 145.0000 mg | ORAL_TABLET | Freq: Every day | ORAL | 4 refills | Status: DC

## 2021-02-03 NOTE — Progress Notes (Signed)
BP 127/81   Pulse 61   Temp 98.8 F (37.1 C)   Wt 201 lb (91.2 kg)   SpO2 97%   BMI 33.97 kg/m    Subjective:    Patient ID: Jessica Kennedy, female    DOB: November 30, 1970, 50 y.o.   MRN: [id number]  HPI: Jessica Kennedy is a 50 y.o. female presenting on 02/03/2021 for Hyperlipidemia   HPI   Chief Complaint  Patient presents with   Hyperlipidemia     She thinks she has menopause.  Last menses was just a little bit of stain.  That was early October.   No mood swings.  Some hot flashes for 3 or 4 months.    She is working part-time.  She is still "looking for something for full time to dedicate her life to".   She is Missing her kids and feels like she has some depression.  She has been using Some etoh and smoking although she doesn't use those daily.  She feels like she could use some better coping strategies.  She does not want to take medication.  She has no SI, HI.      Relevant past medical, surgical, family and social history reviewed and updated as indicated. Interim medical history since our last visit reviewed. Allergies and medications reviewed and updated.  CURRENT MEDS: Fish oil 2 or 3 capsules daily  Review of Systems  Per HPI unless specifically indicated above     Objective:    BP 127/81   Pulse 61   Temp 98.8 F (37.1 C)   Wt 201 lb (91.2 kg)   SpO2 97%   BMI 33.97 kg/m   Wt Readings from Last 3 Encounters:  02/03/21 201 lb (91.2 kg)  03/13/20 190 lb 12 oz (86.5 kg)  03/12/20 189 lb (85.7 kg)    Physical Exam Vitals reviewed.  Constitutional:      General: She is not in acute distress.    Appearance: She is well-developed. She is not ill-appearing.  HENT:     Head: Normocephalic and atraumatic.  Cardiovascular:     Rate and Rhythm: Normal rate and regular rhythm.  Pulmonary:     Effort: Pulmonary effort is normal.     Breath sounds: Normal breath sounds.  Abdominal:     General: Bowel sounds are normal.     Palpations:  Abdomen is soft. There is no mass.     Tenderness: There is no abdominal tenderness.  Musculoskeletal:     Cervical back: Neck supple.     Right lower leg: No edema.     Left lower leg: No edema.  Lymphadenopathy:     Cervical: No cervical adenopathy.  Skin:    General: Skin is warm and dry.  Neurological:     Mental Status: She is alert and oriented to person, place, and time.  Psychiatric:        Attention and Perception: Attention normal.        Mood and Affect: Affect is tearful. Affect is not inappropriate.        Speech: Speech normal.        Behavior: Behavior normal. Behavior is cooperative.    Results for orders placed or performed during the hospital encounter of 02/02/21  Lipid panel  Result Value Ref Range   Cholesterol 254 (H) 0 - 200 mg/dL   Triglycerides 371 (H) <150 mg/dL   HDL 35 (L) >69 mg/dL   Total CHOL/HDL Ratio 7.3 RATIO   VLDL  UNABLE TO CALCULATE IF TRIGLYCERIDE OVER 400 mg/dL 0 - 40 mg/dL   LDL Cholesterol UNABLE TO CALCULATE IF TRIGLYCERIDE OVER 400 mg/dL 0 - 99 mg/dL  Comprehensive metabolic panel  Result Value Ref Range   Sodium 136 135 - 145 mmol/L   Potassium 4.4 3.5 - 5.1 mmol/L   Chloride 107 98 - 111 mmol/L   CO2 22 22 - 32 mmol/L   Glucose, Bld 102 (H) 70 - 99 mg/dL   BUN 16 6 - 20 mg/dL   Creatinine, Ser 4.23 0.44 - 1.00 mg/dL   Calcium 8.8 (L) 8.9 - 10.3 mg/dL   Total Protein 6.6 6.5 - 8.1 g/dL   Albumin 3.7 3.5 - 5.0 g/dL   AST 18 15 - 41 U/L   ALT 18 0 - 44 U/L   Alkaline Phosphatase 72 38 - 126 U/L   Total Bilirubin 0.5 0.3 - 1.2 mg/dL   GFR, Estimated >53 >61 mL/min   Anion gap 7 5 - 15  LDL cholesterol, direct  Result Value Ref Range   Direct LDL 112.4 (H) 0 - 99 mg/dL        Assessment & Plan:    Encounter Diagnoses  Name Primary?   Hyperlipidemia, unspecified hyperlipidemia type Yes   Screening for colon cancer      -Reviewed labs with pt -pt will speak with Counselor after appointment to discuss possibility of  appointment in the future.  Pt Declines medication -Mammogram up to date -pt is given FIT test for colon cancer screening  -Trial of fenofibrate for dyslipidemia -encouraged Covid booster -pt was counseled on things to expect with menopause.  Discussed she sounds to be in peri-menopause.  Encouraged to get plenty of calcium in her diet -pt to follow up 3 months.  She is to contact office sooner prn (Spent with pt)

## 2021-02-03 NOTE — Telephone Encounter (Signed)
[employer] left voicemail for patient regarding scheduling for next counseling appointment, contact information was provided.

## 2021-02-03 NOTE — Telephone Encounter (Signed)
[facility] met with patient on current date to introduce himself and . [facility] provided active listening and validation as patient shared about depression/anxiety related to various stressors. [facility] led patient in progressive muscle relaxation exercise and informed patient that a call would be made in near future to schedule next appointment.

## 2021-02-11 ENCOUNTER — Telehealth: Payer: Self-pay | Admitting: Licensed Clinical Social Worker

## 2021-02-11 NOTE — Telephone Encounter (Signed)
[facility] reached patient via phone call, next [facility] appointment was scheduled for 11/15 at 10 am

## 2021-02-17 ENCOUNTER — Ambulatory Visit: Payer: Medicaid Other | Admitting: Licensed Clinical Social Worker

## 2021-02-17 ENCOUNTER — Telehealth: Payer: Self-pay | Admitting: Licensed Clinical Social Worker

## 2021-02-17 NOTE — Telephone Encounter (Signed)
Patient no-showed for 10 am appointment on current date (11/15), [facility] attempted to reach patient via phone call to check on her and reschedule appointment, patient did not answer, voicemail was left encouraging patient to call [facility] back.

## 2021-03-09 ENCOUNTER — Ambulatory Visit: Payer: Medicaid Other | Admitting: Physician Assistant

## 2021-04-22 ENCOUNTER — Other Ambulatory Visit: Payer: Self-pay | Admitting: Physician Assistant

## 2021-04-22 DIAGNOSIS — E785 Hyperlipidemia, unspecified: Secondary | ICD-10-CM

## 2021-05-05 ENCOUNTER — Other Ambulatory Visit: Payer: Self-pay

## 2021-05-05 ENCOUNTER — Ambulatory Visit: Payer: Medicaid Other | Admitting: Physician Assistant

## 2021-05-05 ENCOUNTER — Encounter: Payer: Self-pay | Admitting: Physician Assistant

## 2021-05-05 VITALS — BP 121/86 | HR 69 | Temp 98.6°F | Wt 200.0 lb

## 2021-05-05 DIAGNOSIS — E785 Hyperlipidemia, unspecified: Secondary | ICD-10-CM

## 2021-05-05 MED ORDER — FENOFIBRATE 145 MG PO TABS: 145.0000 mg | ORAL_TABLET | Freq: Every day | ORAL | 4 refills | Status: DC

## 2021-05-05 NOTE — Progress Notes (Signed)
° °  BP 121/86    Pulse 69    Temp 98.6 F (37 C)    Wt 200 lb (90.7 kg)    SpO2 98%    BMI 33.80 kg/m    Subjective:    Patient ID: Jessica Kennedy, female    DOB: May 22, 1970, 51 y.o.   MRN: [id number]  HPI: Jessica Kennedy is a 51 y.o. 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  Her menses are more stable  She didn't get her labs drawn yet.   She has no complaints today.     Relevant past medical, surgical, family and social history reviewed and updated as indicated. Interim medical history since our last visit reviewed. Allergies and medications reviewed and updated.   Current Outpatient Medications:    fenofibrate (TRICOR) 145 MG tablet, Take 1 tablet (145 mg total) by mouth daily., Disp: 30 tablet, Rfl: 4    Review of Systems  Per HPI unless specifically indicated above     Objective:    BP 121/86    Pulse 69    Temp 98.6 F (37 C)    Wt 200 lb (90.7 kg)    SpO2 98%    BMI 33.80 kg/m   Wt Readings from Last 3 Encounters:  05/05/21 200 lb (90.7 kg)  02/03/21 201 lb (91.2 kg)  03/13/20 190 lb 12 oz (86.5 kg)    Physical Exam Vitals reviewed.  Constitutional:      General: She is not in acute distress.    Appearance: She is well-developed. She is not ill-appearing.  HENT:     Head: Normocephalic and atraumatic.  Cardiovascular:     Rate and Rhythm: Normal rate and regular rhythm.  Pulmonary:     Effort: Pulmonary effort is normal.     Breath sounds: Normal breath sounds.  Abdominal:     General: Bowel sounds are normal.     Palpations: Abdomen is soft. There is no mass.     Tenderness: There is no abdominal tenderness.  Musculoskeletal:     Cervical back: Neck supple.     Right lower leg: No edema.     Left lower leg: No edema.  Lymphadenopathy:     Cervical: No cervical  adenopathy.  Skin:    General: Skin is warm and dry.  Neurological:     Mental Status: She is alert and oriented to person, place, and time.  Psychiatric:        Attention and Perception: Attention normal.        Mood and Affect: Affect is not inappropriate.        Speech: Speech normal.        Behavior: Behavior normal. Behavior is cooperative.          Assessment & Plan:   Encounter Diagnosis  Name Primary?   Hyperlipidemia, unspecified hyperlipidemia type Yes     -She wants to wait on counseling.  Discussed that It is available if she changes her mind -pt to Get labs drawn.  she Will be called with results -Pt will return her FIT test she has at home for colon cancer screening -screening Mammogram early april -pt to follow up 3 months.  She is to contact office sooner prn

## 2021-07-06 ENCOUNTER — Telehealth: Payer: Self-pay

## 2021-07-06 NOTE — Telephone Encounter (Signed)
Spoke with pt & informed to come in to sign mammogram form, pt stated she will be in when she has time. ?

## 2021-07-09 ENCOUNTER — Telehealth: Payer: Self-pay

## 2021-07-09 ENCOUNTER — Other Ambulatory Visit: Payer: Self-pay | Admitting: Physician Assistant

## 2021-07-09 NOTE — Telephone Encounter (Signed)
Called pt in regards to request for atorvastatin as she is now on fenofibrate, also needs fasting labs done, left vm to call back. ?

## 2021-07-13 ENCOUNTER — Other Ambulatory Visit: Payer: Self-pay | Admitting: Physician Assistant

## 2021-07-13 ENCOUNTER — Telehealth: Payer: Self-pay

## 2021-07-13 MED ORDER — FENOFIBRATE 145 MG PO TABS: 145.0000 mg | ORAL_TABLET | Freq: Every day | ORAL | 4 refills | Status: DC

## 2021-07-13 NOTE — Telephone Encounter (Signed)
Spoke with pt who states pharmacy had mix up with medication & says she is taking Fenofibrate, informed pt fasting labs need to be done, pt stated she will get them done. ?

## 2021-08-11 ENCOUNTER — Encounter: Payer: Self-pay | Admitting: Physician Assistant

## 2021-08-11 ENCOUNTER — Ambulatory Visit: Payer: Medicaid Other | Admitting: Physician Assistant

## 2021-08-11 VITALS — BP 110/88 | HR 58 | Temp 97.1°F | Wt 202.8 lb

## 2021-08-11 DIAGNOSIS — E669 Obesity, unspecified: Secondary | ICD-10-CM

## 2021-08-11 DIAGNOSIS — M25551 Pain in right hip: Secondary | ICD-10-CM

## 2021-08-11 DIAGNOSIS — E785 Hyperlipidemia, unspecified: Secondary | ICD-10-CM

## 2021-08-11 MED ORDER — FENOFIBRATE 145 MG PO TABS: 145.0000 mg | ORAL_TABLET | Freq: Every day | ORAL | 0 refills | Status: DC

## 2021-08-11 NOTE — Progress Notes (Signed)
? ?BP 110/88   Pulse (!) 58   Temp (!) 97.1 ?F (36.2 ?C)   Wt 202 lb 12 oz (92 kg)   SpO2 99%   BMI 34.26 kg/m?   ? ?Subjective:  ? ? Patient ID: Jessica Kennedy, female    DOB: 09-09-1970, 51 y.o.   MRN: [id number] ? ?HPI: ?[name] is a 51 y.o. female presenting on 08/11/2021 for Hyperlipidemia ? ? ?HPI ? ? ?Chief Complaint  ?Patient presents with  ? Hyperlipidemia  ? ? ?Pt reports some stress.  She is Trying to get a new job. She isn't talking to a counselor now but says she is good for now.  ? ?She has Some right hip pain.  She says it Feels like a muscle.  She says it Hurts going up stairs and when moving.  She also Now has some right shoulder pain.  She Wonders if it is due to sleeping on her side.    Hip pain for 6-7 months.   Shoulder pain for maybe 3 months.  She drives a lot also and wonders if it is due to that.  She drives a sedan.  She Also she has a lack of exercise and wonders if that plays a role.  She has had no known injury. ? ?She did not get her labs drawn.  ? ? ?Relevant past medical, surgical, family and social history reviewed and updated as indicated. Interim medical history since our last visit reviewed. ?Allergies and medications reviewed and updated. ? ? ?Current Outpatient Medications:  ?  fenofibrate (TRICOR) 145 MG tablet, Take 1 tablet (145 mg total) by mouth daily., Disp: 30 tablet, Rfl: 4 ? ? ? ?Review of Systems ? ?Per HPI unless specifically indicated above ? ?   ?Objective:  ?  ?BP 110/88   Pulse (!) 58   Temp (!) 97.1 ?F (36.2 ?C)   Wt 202 lb 12 oz (92 kg)   SpO2 99%   BMI 34.26 kg/m?   ?Wt Readings from Last 3 Encounters:  ?08/11/21 202 lb 12 oz (92 kg)  ?05/05/21 200 lb (90.7 kg)  ?02/03/21 201 lb (91.2 kg)  ?  ?Physical Exam ?Vitals reviewed.  ?Constitutional:   ?   General: She is not in acute distress. ?   Appearance: She is well-developed. She is not ill-appearing.  ?HENT:  ?   Head: Normocephalic and atraumatic.  ?Cardiovascular:  ?   Rate and Rhythm:  Normal rate and regular rhythm.  ?   Pulses:     ?     Femoral pulses are 2+ on the right side. ?Pulmonary:  ?   Effort: Pulmonary effort is normal.  ?   Breath sounds: Normal breath sounds.  ?Abdominal:  ?   General: Bowel sounds are normal.  ?   Palpations: Abdomen is soft. There is no mass.  ?   Tenderness: There is no abdominal tenderness.  ?Musculoskeletal:  ?   Right shoulder: Normal. No swelling, tenderness or bony tenderness. Normal range of motion. Normal pulse.  ?   Cervical back: Neck supple.  ?   Right hip: No bony tenderness. Normal range of motion. Normal strength.  ?   Right upper leg: No swelling or tenderness.  ?   Right knee: Normal range of motion.  ?   Right lower leg: No edema.  ?   Left lower leg: No edema.  ?   Comments: There is tenderness of soft tissues right groin.  There is no tenderness  left groin.   ?Lymphadenopathy:  ?   Cervical: No cervical adenopathy.  ?   Lower Body: No right inguinal adenopathy.  ?Skin: ?   General: Skin is warm and dry.  ?Neurological:  ?   Mental Status: She is alert and oriented to person, place, and time.  ?Psychiatric:     ?   Behavior: Behavior normal.  ? ? ? ? ? ?   ?Assessment & Plan:  ? ?Encounter Diagnoses  ?Name Primary?  ? Hyperlipidemia, unspecified hyperlipidemia type Yes  ? Obesity, unspecified classification, unspecified obesity type, unspecified whether serious comorbidity present   ? Pain in right hip   ? ? ? ? ?-pt Needs to get fasting labs drawn.  She will be called with results ?-She already signed mammogram order. Nurse checking on it as mammogram scholarship application was faxed a month ago ?-Refer for PT for hip pain ?-pt is given application for [employer] financial assistance /  [facility] ?-recommended she Try sleeping on her back instead of her right side.  Recommended she try to some heat like heating pad or bathtub to reduce the pain ?-Pt to call gyn for PAP- she has FP medicaid ?-Pt is reminded to return FIT test that she was given for  colon cancer screening test ?-pt to follow up F/u 4 months.  She is to contact office sooner prn ?

## 2021-08-14 ENCOUNTER — Other Ambulatory Visit: Payer: Self-pay | Admitting: Physician Assistant

## 2021-08-14 DIAGNOSIS — Z1231 Encounter for screening mammogram for malignant neoplasm of breast: Secondary | ICD-10-CM

## 2021-08-17 ENCOUNTER — Ambulatory Visit
Admission: RE | Admit: 2021-08-17 | Discharge: 2021-08-17 | Disposition: A | Discharge status: Home / Self Care | Payer: No Typology Code available for payment source | Source: Ambulatory Visit | Attending: Physician Assistant | Admitting: Physician Assistant

## 2021-08-17 DIAGNOSIS — Z1231 Encounter for screening mammogram for malignant neoplasm of breast: Secondary | ICD-10-CM

## 2021-09-01 ENCOUNTER — Ambulatory Visit (HOSPITAL_COMMUNITY): Payer: Self-pay | Attending: Physician Assistant | Admitting: Physical Therapy

## 2021-09-01 NOTE — Therapy (Incomplete)
OUTPATIENT PHYSICAL THERAPY LOWER EXTREMITY EVALUATION   Patient Name: Jessica Kennedy MRN: [id number] DOB:[birth year], 51 y.o., female Today's Date: 09/01/2021    No past medical history on file. No past surgical history on file. Patient Active Problem List   Diagnosis Date Noted   Obesity 06/15/2015   Hyperlipidemia 03/19/2015   Cigarette nicotine dependence, uncomplicated [id number]    PCP: Soyla Dryer, PA-C   REFERRING PROVIDER: Soyla Dryer, PA-C   REFERRING DIAG: [phone] (ICD-10-CM) - Pain in right hip   THERAPY DIAG:  No diagnosis found.  Rationale for Evaluation and Treatment Rehabilitation  ONSET DATE: ***  SUBJECTIVE:   SUBJECTIVE STATEMENT: ***  PERTINENT HISTORY: ***  PAIN:  Are you having pain? {OPRCPAIN:27236}  PRECAUTIONS: {Therapy precautions:24002}  WEIGHT BEARING RESTRICTIONS {Yes ***/No:24003}  FALLS:  Has patient fallen in last 6 months? {fallsyesno:27318}  LIVING ENVIRONMENT: Lives with: {OPRC lives with:25569::"lives with their family"} Lives in: {Lives in:25570} Stairs: {opstairs:27293} Has following equipment at home: {Assistive devices:23999}  OCCUPATION: ***  PLOF: {PLOF:24004}  PATIENT GOALS ***   OBJECTIVE:   DIAGNOSTIC FINDINGS: ***  PATIENT SURVEYS:  {rehab surveys:24030}  COGNITION:  Overall cognitive status: {cognition:24006}     SENSATION: {sensation:27233}  EDEMA:  {edema:24020}  MUSCLE LENGTH: Hamstrings: Right *** deg; Left *** deg Thomas test: Right *** deg; Left *** deg  POSTURE: {posture:25561}  PALPATION: ***  LOWER EXTREMITY ROM:  Active ROM Right eval Left eval  Hip flexion    Hip extension    Hip abduction    Hip adduction    Hip internal rotation    Hip external rotation    Knee flexion    Knee extension    Ankle dorsiflexion    Ankle plantarflexion    Ankle inversion    Ankle eversion     (Blank rows = not tested)  LOWER EXTREMITY MMT:  MMT Right eval  Left eval  Hip flexion    Hip extension    Hip abduction    Hip adduction    Hip internal rotation    Hip external rotation    Knee flexion    Knee extension    Ankle dorsiflexion    Ankle plantarflexion    Ankle inversion    Ankle eversion     (Blank rows = not tested)  LOWER EXTREMITY SPECIAL TESTS:  {LEspecialtests:26242}  FUNCTIONAL TESTS:  {Functional tests:24029}  GAIT: Distance walked: *** Assistive device utilized: {Assistive devices:23999} Level of assistance: {Levels of assistance:24026} Comments: ***    TODAY'S TREATMENT: 09/01/21 ***   PATIENT EDUCATION:  Education details: Patient educated on exam findings, POC, scope of PT, HEP, and ***. Person educated: Patient Education method: Explanation, Demonstration, and Handouts Education comprehension: verbalized understanding, returned demonstration, verbal cues required, and tactile cues required    HOME EXERCISE PROGRAM: 09/01/21***  ASSESSMENT:  CLINICAL IMPRESSION: Patient a 51 y.o. y.o. female who was seen today for physical therapy evaluation and treatment for R hip pain. Patient presents with pain limited deficits in R hip strength, ROM, endurance, activity tolerance, and functional mobility with ADL. Patient is having to modify and restrict ADL as indicated by outcome measure score as well as subjective information and objective measures which is affecting overall participation. Patient will benefit from skilled physical therapy in order to improve function and reduce impairment.    OBJECTIVE IMPAIRMENTS {opptimpairments:25111}.   ACTIVITY LIMITATIONS {activitylimitations:27494}  PARTICIPATION LIMITATIONS: {participationrestrictions:25113}  PERSONAL FACTORS {Personal factors:25162} are also affecting patient's functional outcome.   REHAB POTENTIAL: {rehabpotential:25112}  CLINICAL DECISION MAKING: {  clinical decision making:25114}  EVALUATION COMPLEXITY: {Evaluation  complexity:25115}   GOALS: Goals reviewed with patient? {yes/no:20286}  SHORT TERM GOALS: Target date: {follow up:25551}  Patient will be independent with HEP in order to improve functional outcomes. Baseline: *** Goal status: {GOALSTATUS:25110}  2.  Patient will report at least 25% improvement in symptoms for improved quality of life. Baseline: *** Goal status: {GOALSTATUS:25110}  3.  *** Baseline: *** Goal status: {GOALSTATUS:25110}  4.  *** Baseline: *** Goal status: {GOALSTATUS:25110}  5.  *** Baseline: *** Goal status: {GOALSTATUS:25110}  6.  *** Baseline: *** Goal status: {GOALSTATUS:25110}  LONG TERM GOALS: Target date: {follow up:25551}  Patient will report at least 75% improvement in symptoms for improved quality of life. Baseline: *** Goal status: {GOALSTATUS:25110}  2.  Patient will improve FOTO score by at least *** points in order to indicate improved tolerance to activity. Baseline: *** Goal status: {GOALSTATUS:25110}  3.  Patient will be able to navigate stairs with reciprocal pattern without compensation in order to demonstrate improved LE strength. Baseline: *** Goal status: {GOALSTATUS:25110}  4.  Patient will be able to ambulate at least *** feet in 2MWT in order to demonstrate improved tolerance to activity. Baseline: *** Goal status: {GOALSTATUS:25110}  5.  Patient will *** Baseline: *** Goal status: {GOALSTATUS:25110}  6.  *** Baseline: *** Goal status: {GOALSTATUS:25110}    PLAN: PT FREQUENCY: {rehab frequency:25116}  PT DURATION: {rehab duration:25117}  PLANNED INTERVENTIONS: Therapeutic exercises, Therapeutic activity, Neuromuscular re-education, Balance training, Gait training, Patient/Family education, Joint manipulation, Joint mobilization, Stair training, Orthotic/Fit training, DME instructions, Aquatic Therapy, Dry Needling, Electrical stimulation, Spinal manipulation, Spinal mobilization, Cryotherapy, Moist heat,  Compression bandaging, scar mobilization, Splintting, Taping, Traction, Ultrasound, Ionotophoresis 4mg /ml Dexamethasone, and Manual therapy  PLAN FOR NEXT SESSION: ***   Jessica Kennedy, PT 09/01/2021, 9:05 AM

## 2021-09-21 ENCOUNTER — Telehealth: Payer: Self-pay

## 2021-09-21 NOTE — Telephone Encounter (Signed)
Pt called to receive guidance with submitting her CAFA application and documents.     Plan -Pt was scheduled an appt to [facility] office for 6.21.23 (Wed) to bring her CAFA application and documents for submittal to the CAFA dept  Text message reminder sent to remind of appointment documents to bring to appt .

## 2021-10-01 ENCOUNTER — Ambulatory Visit (HOSPITAL_COMMUNITY): Payer: Self-pay

## 2021-10-15 NOTE — Congregational Nurse Program (Signed)
pt requested an update regarding the submittal of her CAFA application in addition to requested to shop at the on-site [employer] due to food insecurity   Pt was advised her application was submitted by our office on 6.29.23 and has been approved by CAFA dept as 7.11.23  at 100% (6.21.23 to 03/25/22  Pt also shopped during visit for family of 3 and received 32.74lbs of food

## 2021-10-21 ENCOUNTER — Ambulatory Visit (HOSPITAL_COMMUNITY): Payer: Self-pay | Attending: Physician Assistant

## 2021-10-28 NOTE — Congregational Nurse Program (Signed)
Patient presented to [facility] for return visit and received 16.1lbs of food for family of  3

## 2021-12-02 ENCOUNTER — Other Ambulatory Visit: Payer: Self-pay | Admitting: Physician Assistant

## 2021-12-02 DIAGNOSIS — E785 Hyperlipidemia, unspecified: Secondary | ICD-10-CM

## 2021-12-10 ENCOUNTER — Other Ambulatory Visit (HOSPITAL_COMMUNITY)
Admission: RE | Admit: 2021-12-10 | Discharge: 2021-12-10 | Disposition: A | Discharge status: Home / Self Care | Payer: No Typology Code available for payment source | Source: Ambulatory Visit | Attending: Physician Assistant | Admitting: Physician Assistant

## 2021-12-10 DIAGNOSIS — E785 Hyperlipidemia, unspecified: Secondary | ICD-10-CM

## 2021-12-10 LAB — COMPREHENSIVE METABOLIC PANEL
ALT: 22 U/L (ref 0–44)
AST: 23 U/L (ref 15–41)
Albumin: 3.7 g/dL (ref 3.5–5.0)
Alkaline Phosphatase: 59 U/L (ref 38–126)
Anion gap: 7 (ref 5–15)
BUN: 17 mg/dL (ref 6–20)
CO2: 20 mmol/L — ABNORMAL LOW (ref 22–32)
Calcium: 8.6 mg/dL — ABNORMAL LOW (ref 8.9–10.3)
Chloride: 110 mmol/L (ref 98–111)
Creatinine, Ser: 0.74 mg/dL (ref 0.44–1.00)
GFR, Estimated: >60 mL/min (ref >60)
Glucose, Bld: 99 mg/dL (ref 70–99)
Potassium: 4.1 mmol/L (ref 3.5–5.1)
Sodium: 137 mmol/L (ref 135–145)
Total Bilirubin: 0.7 mg/dL (ref 0.3–1.2)
Total Protein: 6.7 g/dL (ref 6.5–8.1)

## 2021-12-10 LAB — LIPID PANEL
Cholesterol: 293 mg/dL — ABNORMAL HIGH (ref 0–200)
HDL: 32 mg/dL — ABNORMAL LOW (ref >40)
LDL Cholesterol: UNDETERMINED mg/dL (ref 0–99)
Total CHOL/HDL Ratio: 9.2 RATIO
Triglycerides: 623 mg/dL — ABNORMAL HIGH (ref <150)
VLDL: UNDETERMINED mg/dL (ref 0–40)

## 2021-12-10 LAB — LDL CHOLESTEROL, DIRECT: Direct LDL: 133 mg/dL — ABNORMAL HIGH (ref 0–99)

## 2021-12-12 IMAGING — DX DG FINGER MIDDLE 2+V*L*
3 series · 3 of 3 positions shown · non-contrast
Comparison: None.

CLINICAL DATA: Fall 2 months ago.  Possible foreign body.

EXAM:
LEFT MIDDLE FINGER 2+V

[finger ap]
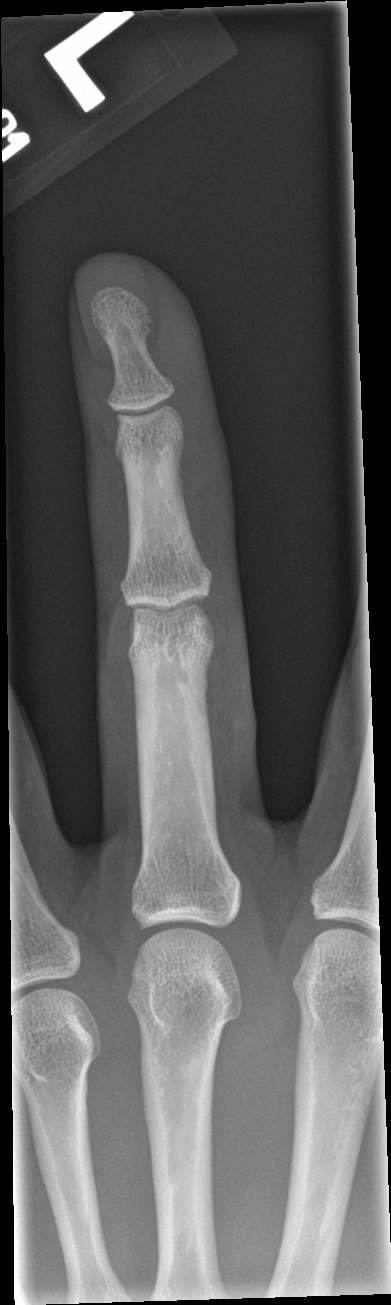

[finger obl]
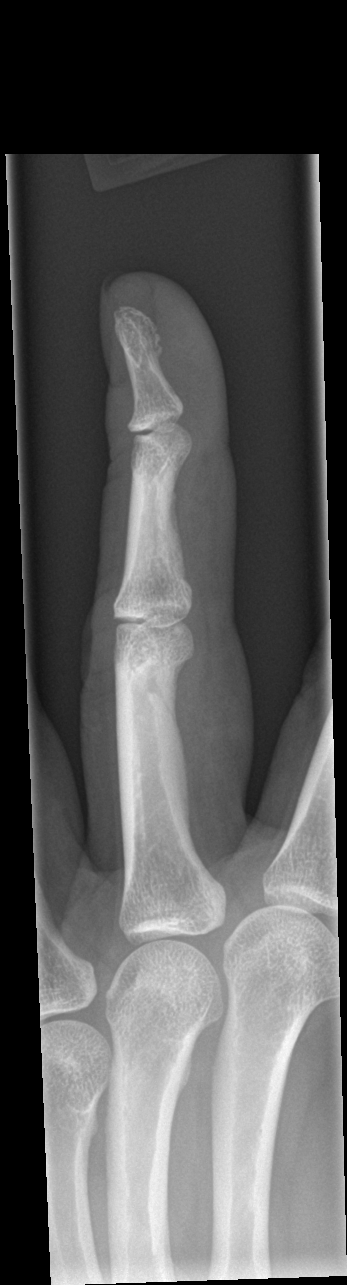

[finger lat]
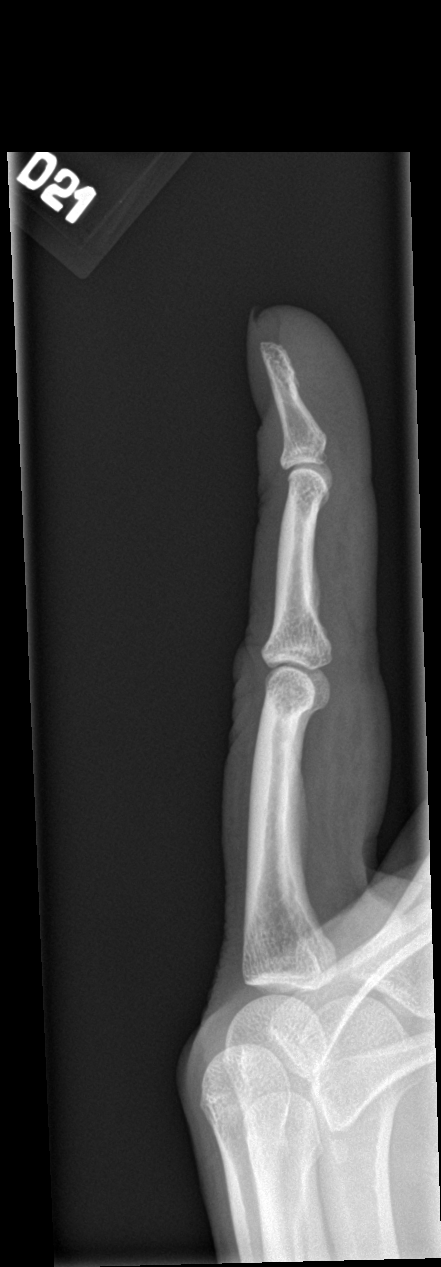

[3 of 3 positions shown; findings below may reference images not displayed]

FINDINGS: The mineralization and alignment are normal. There is no evidence of
acute fracture or dislocation. The joint spaces are preserved. No
foreign body or soft tissue emphysema is identified. There is
possible mild soft tissue swelling proximally.
IMPRESSION: No acute osseous findings or evidence of foreign body. Possible mild
soft tissue swelling.

## 2021-12-15 ENCOUNTER — Ambulatory Visit: Payer: Medicaid Other | Admitting: Physician Assistant

## 2021-12-15 ENCOUNTER — Encounter: Payer: Self-pay | Admitting: Physician Assistant

## 2021-12-15 VITALS — BP 126/82 | HR 66 | Temp 98.0°F | Wt 202.7 lb

## 2021-12-15 DIAGNOSIS — Z5320 Procedure and treatment not carried out because of patient's decision for unspecified reasons: Secondary | ICD-10-CM

## 2021-12-15 DIAGNOSIS — E781 Pure hyperglyceridemia: Secondary | ICD-10-CM

## 2021-12-15 MED ORDER — FENOFIBRATE 145 MG PO TABS: 145.0000 mg | ORAL_TABLET | Freq: Every day | ORAL | 4 refills | Status: DC

## 2021-12-15 NOTE — Progress Notes (Signed)
BP 126/82   Pulse 66   Temp 98 F (36.7 C)   Wt 202 lb 11.2 oz (91.9 kg)   SpO2 99%   BMI 34.26 kg/m    Subjective:    Patient ID: Jessica Kennedy, female    DOB: 03-25-1971, 51 y.o.   MRN: [id number]  HPI: Jessica Kennedy is a 51 y.o. female presenting on 12/15/2021 for Hyperlipidemia (Ran out of fenofibrate about a month and half ago.)   HPI    Chief Complaint  Patient presents with   Hyperlipidemia    Ran out of fenofibrate about a month and half ago.     Pt reports that she is having Some stress.  She says Things moving slowly.  She is Waiting on a job application.   Her Sons are 74 and 60-  she gets them summers and always is sad when they leave at end of summer. Pt never returned her FIT test given for colon cancer screening.  By this point she doesn't remember where it is at. She is not exercising She doesn't have any complaints     Relevant past medical, surgical, family and social history reviewed and updated as indicated. Interim medical history since our last visit reviewed. Allergies and medications reviewed and updated.   Current Outpatient Medications:    fenofibrate (TRICOR) 145 MG tablet, Take 1 tablet (145 mg total) by mouth daily. (Patient not taking: Reported on 12/15/2021), Disp: 30 tablet, Rfl: 0    Review of Systems  Per HPI unless specifically indicated above     Objective:    BP 126/82   Pulse 66   Temp 98 F (36.7 C)   Wt 202 lb 11.2 oz (91.9 kg)   SpO2 99%   BMI 34.26 kg/m   Wt Readings from Last 3 Encounters:  12/15/21 202 lb 11.2 oz (91.9 kg)  08/11/21 202 lb 12 oz (92 kg)  05/05/21 200 lb (90.7 kg)    Physical Exam Vitals reviewed.  Constitutional:      General: She is not in acute distress.    Appearance: She is well-developed. She is not ill-appearing.  HENT:     Head: Normocephalic and atraumatic.  Cardiovascular:     Rate and Rhythm: Normal rate and regular rhythm.  Pulmonary:     Effort: Pulmonary  effort is normal.     Breath sounds: Normal breath sounds.  Abdominal:     General: Bowel sounds are normal.     Palpations: Abdomen is soft. There is no mass.     Tenderness: There is no abdominal tenderness.  Musculoskeletal:     Cervical back: Neck supple.     Right lower leg: No edema.     Left lower leg: No edema.  Lymphadenopathy:     Cervical: No cervical adenopathy.  Skin:    General: Skin is warm and dry.  Neurological:     Mental Status: She is alert and oriented to person, place, and time.  Psychiatric:        Attention and Perception: Attention normal.        Speech: Speech normal.        Behavior: Behavior normal. Behavior is cooperative.     Results for orders placed or performed during the hospital encounter of 12/10/21  Lipid panel  Result Value Ref Range   Cholesterol 293 (H) 0 - 200 mg/dL   Triglycerides 277 (H) <150 mg/dL   HDL 32 (L) >82 mg/dL   Total CHOL/HDL Ratio  9.2 RATIO   VLDL UNABLE TO CALCULATE IF TRIGLYCERIDE OVER 400 mg/dL 0 - 40 mg/dL   LDL Cholesterol UNABLE TO CALCULATE IF TRIGLYCERIDE OVER 400 mg/dL 0 - 99 mg/dL  Comprehensive metabolic panel  Result Value Ref Range   Sodium 137 135 - 145 mmol/L   Potassium 4.1 3.5 - 5.1 mmol/L   Chloride 110 98 - 111 mmol/L   CO2 20 (L) 22 - 32 mmol/L   Glucose, Bld 99 70 - 99 mg/dL   BUN 17 6 - 20 mg/dL   Creatinine, Ser 0.53 0.44 - 1.00 mg/dL   Calcium 8.6 (L) 8.9 - 10.3 mg/dL   Total Protein 6.7 6.5 - 8.1 g/dL   Albumin 3.7 3.5 - 5.0 g/dL   AST 23 15 - 41 U/L   ALT 22 0 - 44 U/L   Alkaline Phosphatase 59 38 - 126 U/L   Total Bilirubin 0.7 0.3 - 1.2 mg/dL   GFR, Estimated >97 >67 mL/min   Anion gap 7 5 - 15  LDL cholesterol, direct  Result Value Ref Range   Direct LDL 133 (H) 0 - 99 mg/dL      Assessment & Plan:   Encounter Diagnoses  Name Primary?   Hypertriglyceridemia Yes   Colon cancer screening declined      -reviewed labs with pt -pt Declines colon cancer screening test/FIT  test -pt Reminded to update pap with gyn (she has FP medicaid) -encouraged regular Exercise -Resume fenofibrate -follow up 3 months.  Pt to contact office sooner prn

## 2022-02-18 ENCOUNTER — Other Ambulatory Visit: Payer: Self-pay | Admitting: Physician Assistant

## 2022-02-18 DIAGNOSIS — E785 Hyperlipidemia, unspecified: Secondary | ICD-10-CM

## 2022-02-18 DIAGNOSIS — E781 Pure hyperglyceridemia: Secondary | ICD-10-CM

## 2022-03-05 DIAGNOSIS — Z419 Encounter for procedure for purposes other than remedying health state, unspecified: Secondary | ICD-10-CM | POA: Diagnosis not present

## 2022-03-10 ENCOUNTER — Ambulatory Visit: Payer: Medicaid Other | Admitting: Physician Assistant

## 2022-03-16 ENCOUNTER — Ambulatory Visit: Payer: Medicaid Other | Admitting: Physician Assistant

## 2022-07-22 DIAGNOSIS — Z01419 Encounter for gynecological examination (general) (routine) without abnormal findings: Secondary | ICD-10-CM | POA: Diagnosis not present

## 2022-07-22 DIAGNOSIS — E6609 Other obesity due to excess calories: Secondary | ICD-10-CM | POA: Diagnosis not present

## 2022-07-22 DIAGNOSIS — L659 Nonscarring hair loss, unspecified: Secondary | ICD-10-CM | POA: Diagnosis not present

## 2022-07-22 DIAGNOSIS — F32A Depression, unspecified: Secondary | ICD-10-CM | POA: Diagnosis not present

## 2022-07-22 DIAGNOSIS — F419 Anxiety disorder, unspecified: Secondary | ICD-10-CM | POA: Diagnosis not present

## 2022-07-22 DIAGNOSIS — Z6835 Body mass index (BMI) 35.0-35.9, adult: Secondary | ICD-10-CM | POA: Diagnosis not present

## 2022-07-22 DIAGNOSIS — E785 Hyperlipidemia, unspecified: Secondary | ICD-10-CM | POA: Diagnosis not present

## 2022-07-22 DIAGNOSIS — I83813 Varicose veins of bilateral lower extremities with pain: Secondary | ICD-10-CM | POA: Diagnosis not present

## 2022-07-22 DIAGNOSIS — Z87898 Personal history of other specified conditions: Secondary | ICD-10-CM | POA: Diagnosis not present

## 2022-07-22 DIAGNOSIS — R5383 Other fatigue: Secondary | ICD-10-CM | POA: Diagnosis not present

## 2022-07-22 LAB — COLOGUARD

## 2022-07-29 LAB — HM PAP SMEAR

## 2022-10-05 DIAGNOSIS — E785 Hyperlipidemia, unspecified: Secondary | ICD-10-CM | POA: Diagnosis not present

## 2022-10-05 DIAGNOSIS — Z1329 Encounter for screening for other suspected endocrine disorder: Secondary | ICD-10-CM | POA: Diagnosis not present

## 2022-10-05 DIAGNOSIS — Z131 Encounter for screening for diabetes mellitus: Secondary | ICD-10-CM | POA: Diagnosis not present

## 2022-10-05 DIAGNOSIS — E559 Vitamin D deficiency, unspecified: Secondary | ICD-10-CM | POA: Diagnosis not present

## 2022-10-05 LAB — LAB REPORT - SCANNED
A1c: 5.3
Calcium: 9.1
EGFR: 81.9
TSH: 1.95 (ref 0.41–5.90)

## 2022-11-29 ENCOUNTER — Other Ambulatory Visit: Payer: Self-pay | Admitting: *Deleted

## 2022-11-29 DIAGNOSIS — I8393 Asymptomatic varicose veins of bilateral lower extremities: Secondary | ICD-10-CM

## 2022-12-01 DIAGNOSIS — L649 Androgenic alopecia, unspecified: Secondary | ICD-10-CM | POA: Diagnosis not present

## 2022-12-01 DIAGNOSIS — L918 Other hypertrophic disorders of the skin: Secondary | ICD-10-CM | POA: Diagnosis not present

## 2022-12-09 ENCOUNTER — Ambulatory Visit (HOSPITAL_COMMUNITY): Payer: Medicaid Other

## 2023-02-06 NOTE — Patient Instructions (Signed)

## 2023-02-06 NOTE — Progress Notes (Unsigned)
   New Patient Office Visit   Subjective   Patient ID: Fannye Myer, female    DOB: [birth year]  Age: 52 y.o. MRN: [id number]  CC: No chief complaint on file.   HPI Rian Boyington 52 year old female, presents to establish care. She  has no past medical history on file.  HPI    Outpatient Encounter Medications as of 02/07/2023  Medication Sig   fenofibrate (TRICOR) 145 MG tablet Take 1 tablet (145 mg total) by mouth daily.   No facility-administered encounter medications on file as of 02/07/2023.    No past surgical history on file.  ROS    Objective    There were no vitals taken for this visit.  Physical Exam    Assessment & Plan:  There are no diagnoses linked to this encounter.  No follow-ups on file.   Cruzita Lederer Newman Nip, FNP

## 2023-02-07 ENCOUNTER — Ambulatory Visit (INDEPENDENT_AMBULATORY_CARE_PROVIDER_SITE_OTHER): Payer: Medicaid Other | Admitting: Family Medicine

## 2023-02-07 VITALS — BP 116/76 | HR 73 | Ht 64.5 in | Wt 205.0 lb

## 2023-02-07 DIAGNOSIS — R7301 Impaired fasting glucose: Secondary | ICD-10-CM | POA: Diagnosis not present

## 2023-02-07 DIAGNOSIS — L659 Nonscarring hair loss, unspecified: Secondary | ICD-10-CM | POA: Diagnosis not present

## 2023-02-07 DIAGNOSIS — E66811 Obesity, class 1: Secondary | ICD-10-CM

## 2023-02-07 DIAGNOSIS — Z114 Encounter for screening for human immunodeficiency virus [HIV]: Secondary | ICD-10-CM

## 2023-02-07 DIAGNOSIS — E782 Mixed hyperlipidemia: Secondary | ICD-10-CM

## 2023-02-07 DIAGNOSIS — E038 Other specified hypothyroidism: Secondary | ICD-10-CM | POA: Diagnosis not present

## 2023-02-07 DIAGNOSIS — Z6834 Body mass index (BMI) 34.0-34.9, adult: Secondary | ICD-10-CM

## 2023-02-07 DIAGNOSIS — Z136 Encounter for screening for cardiovascular disorders: Secondary | ICD-10-CM

## 2023-02-07 DIAGNOSIS — Z1159 Encounter for screening for other viral diseases: Secondary | ICD-10-CM

## 2023-02-07 DIAGNOSIS — E559 Vitamin D deficiency, unspecified: Secondary | ICD-10-CM

## 2023-02-07 DIAGNOSIS — Z1211 Encounter for screening for malignant neoplasm of colon: Secondary | ICD-10-CM

## 2023-02-07 DIAGNOSIS — E6609 Other obesity due to excess calories: Secondary | ICD-10-CM | POA: Diagnosis not present

## 2023-02-07 DIAGNOSIS — E669 Obesity, unspecified: Secondary | ICD-10-CM | POA: Insufficient documentation

## 2023-02-07 NOTE — Assessment & Plan Note (Signed)
Lipid Panel done today Current regimen includes Crestor 5 mg once daily and Fenofibrate 145 mg once daily Advise on lifestyle modifications follow diet low in saturated fat, reduce dietary salt intake, avoid fatty foods, maintain an exercise routine 3 to 5 days a week for a minimum total of 150 minutes.

## 2023-02-07 NOTE — Assessment & Plan Note (Signed)
Discussed the importance to start eating 3 meals a day including breakfast, drink 8 glasses of water a day ,reduce portion sizes. reduced carbohydrates limit saturated and trans fat, increase servings of vegetables and limit processed foods. Find an activity that you will enjoy and start to be active at least 5 days a week for 30 minutes each day. Keep a food journal or an activity journal to identify triggers that lead to emotional eating Follow up in 4 weeks for Weight loss management   

## 2023-02-08 ENCOUNTER — Encounter: Payer: No Typology Code available for payment source | Admitting: Vascular Surgery

## 2023-02-08 ENCOUNTER — Ambulatory Visit (HOSPITAL_COMMUNITY)
Admission: RE | Admit: 2023-02-08 | Discharge: 2023-02-08 | Disposition: A | Discharge status: Home / Self Care | Payer: Medicaid Other | Source: Ambulatory Visit | Attending: Physician Assistant | Admitting: Physician Assistant

## 2023-02-08 ENCOUNTER — Encounter (HOSPITAL_COMMUNITY): Payer: No Typology Code available for payment source

## 2023-02-08 DIAGNOSIS — I8393 Asymptomatic varicose veins of bilateral lower extremities: Secondary | ICD-10-CM | POA: Insufficient documentation

## 2023-02-24 DIAGNOSIS — L649 Androgenic alopecia, unspecified: Secondary | ICD-10-CM | POA: Diagnosis not present

## 2023-02-28 NOTE — Progress Notes (Signed)
Patient name: Jessica Kennedy MRN: [id number] DOB: 05-29-1970 Sex: female  REASON FOR CONSULT: Varicose veins with numbness  HPI: Jessica Kennedy is a 52 y.o. female, with history of hyperlipidemia that presents for evaluation of varicose veins with numbness.  Past Medical History:  Diagnosis Date   Hair loss    Hyperlipidemia     Past Surgical History:  Procedure Laterality Date   CESAREAN SECTION  03/22/2007   CESAREAN SECTION  02/09/2004    Family History  Problem Relation Age of Onset   Cancer Father        prostate   Myoclonus Son    Jaundice Son    Breast cancer Neg Hx     SOCIAL HISTORY: Social History   Socioeconomic History   Marital status: Divorced    Spouse name: Not on file   Number of children: 2   Years of education: Not on file   Highest education level: Associate degree: occupational, Scientist, product/process development, or vocational program  Occupational History   Occupation: Care Guide    Employer: [employer]  Tobacco Use   Smoking status: Some Days    Current packs/day: 0.25    Average packs/day: 0.3 packs/day for 36.0 years (9.0 ttl pk-yrs)    Types: Cigarettes    Start date: 03/13/1987   Smokeless tobacco: Never  Substance and Sexual Activity   Alcohol use: Yes    Alcohol/week: 0.0 standard drinks of alcohol    Comment: wine with special occassions   Drug use: Not Currently    Types: Marijuana    Comment: none since 2017   Sexual activity: Not Currently  Other Topics Concern   Not on file  Social History Narrative   Not on file   Social Determinants of Health   Financial Resource Strain: Medium Risk (02/07/2023)   Overall Financial Resource Strain (CARDIA)    Difficulty of Paying Living Expenses: Somewhat hard  Food Insecurity: Food Insecurity Present (02/07/2023)   Hunger Vital Sign    Worried About Running Out of Food in the Last Year: Sometimes true    Ran Out of Food in the Last Year: Never true  Transportation Needs: No Transportation  Needs (02/07/2023)   PRAPARE - Administrator, Civil Service (Medical): No    Lack of Transportation (Non-Medical): No  Physical Activity: Unknown (02/07/2023)   Exercise Vital Sign    Days of Exercise per Week: 0 days    Minutes of Exercise per Session: Not on file  Stress: No Stress Concern Present (02/07/2023)   [employer] of Occupational Health - Occupational Stress Questionnaire    Feeling of Stress : Only a little  Social Connections: Moderately Isolated (02/07/2023)   Social Connection and Isolation Panel [NHANES]    Frequency of Communication with Friends and Family: Once a week    Frequency of Social Gatherings with Friends and Family: Once a week    Attends Religious Services: More than 4 times per year    Active Member of [employer] or Organizations: Yes    Attends Engineer, structural: More than 4 times per year    Marital Status: Divorced  Catering manager Violence: Not on file    No Known Allergies  Current Outpatient Medications  Medication Sig Dispense Refill   fenofibrate (TRICOR) 145 MG tablet Take 1 tablet (145 mg total) by mouth daily. 30 tablet 4   minoxidil (LONITEN) 2.5 MG tablet Take 2.5 mg by mouth daily. Take 1/4 tab by mouth once daily.  rosuvastatin (CRESTOR) 5 MG tablet Take 5 mg by mouth daily.     spironolactone (ALDACTONE) 100 MG tablet Take 100 mg by mouth daily.     Vitamin D, Ergocalciferol, (DRISDOL) 1.25 MG (50000 UNIT) CAPS capsule Take 50,000 Units by mouth every 7 (seven) days.     No current facility-administered medications for this visit.    REVIEW OF SYSTEMS:  [X]  denotes positive finding, [ ]  denotes negative finding Cardiac  Comments:  Chest pain or chest pressure: ***   Shortness of breath upon exertion:    Short of breath when lying flat:    Irregular heart rhythm:        Vascular    Pain in calf, thigh, or hip brought on by ambulation:    Pain in feet at night that wakes you up from your sleep:      Blood clot in your veins:    Leg swelling:         Pulmonary    Oxygen at home:    Productive cough:     Wheezing:         Neurologic    Sudden weakness in arms or legs:     Sudden numbness in arms or legs:     Sudden onset of difficulty speaking or slurred speech:    Temporary loss of vision in one eye:     Problems with dizziness:         Gastrointestinal    Blood in stool:     Vomited blood:         Genitourinary    Burning when urinating:     Blood in urine:        Psychiatric    Major depression:         Hematologic    Bleeding problems:    Problems with blood clotting too easily:        Skin    Rashes or ulcers:        Constitutional    Fever or chills:      PHYSICAL EXAM: There were no vitals filed for this visit.  GENERAL: The patient is a well-nourished female, in no acute distress. The vital signs are documented above. CARDIAC: There is a regular rate and rhythm.  VASCULAR: *** PULMONARY: There is good air exchange bilaterally without wheezing or rales. ABDOMEN: Soft and non-tender with normal pitched bowel sounds.  MUSCULOSKELETAL: There are no major deformities or cyanosis. NEUROLOGIC: No focal weakness or paresthesias are detected. SKIN: There are no ulcers or rashes noted. PSYCHIATRIC: The patient has a normal affect.  DATA:    Lower Venous Reflux Study   Patient Name:  Jessica Kennedy  Date of Exam:   02/08/2023  Medical Rec #: [id number]           Accession #:    [id number]  Date of Birth: 1971-03-04           Patient Gender: F  Patient Age:   59 years  Exam Location:  [facility]  Procedure:      VAS [place] LOWER EXTREMITY VENOUS REFLUX  Referring Phys: Lelon Mast RHYNE    ---------------------------------------------------------------------------  -----    Indications: Varicosities, and tingling anterior and posterior distal  thigh.    Performing Technologist: Lowell Guitar RVT, RDMS     Examination Guidelines: A  complete evaluation includes B-mode imaging,  spectral  Doppler, color Doppler, and power Doppler as needed of all accessible  portions  of each vessel.  Bilateral testing is considered an integral part of a  complete  examination. Limited examinations for reoccurring indications may be  performed  as noted. The reflux portion of the exam is performed with the patient in  reverse Trendelenburg.  Significant venous reflux is defined as >500 ms in the superficial venous  system, and >1 second in the deep venous system.     Venous Reflux Times  +--------------+---------+------+-----------+------------+--------+  RIGHT        Reflux NoRefluxReflux TimeDiameter cmsComments                          Yes                                   +--------------+---------+------+-----------+------------+--------+  CFV                    yes   >1 second                       +--------------+---------+------+-----------+------------+--------+  FV prox       no                                              +--------------+---------+------+-----------+------------+--------+  FV mid        no                                              +--------------+---------+------+-----------+------------+--------+  FV dist       no                                              +--------------+---------+------+-----------+------------+--------+  Popliteal    no                                              +--------------+---------+------+-----------+------------+--------+  GSV at SFJ              yes    >500 ms      0.91              +--------------+---------+------+-----------+------------+--------+  GSV prox thighno                            0.99              +--------------+---------+------+-----------+------------+--------+  GSV mid thigh no                            0.37               +--------------+---------+------+-----------+------------+--------+  GSV dist thighno                            0.35              +--------------+---------+------+-----------+------------+--------+  GSV at knee   no                            0.37              +--------------+---------+------+-----------+------------+--------+  GSV prox calf no                            0.29              +--------------+---------+------+-----------+------------+--------+  SSV Pop Fossa no                            0.33              +--------------+---------+------+-----------+------------+--------+  SSV prox calf no                            0.29              +--------------+---------+------+-----------+------------+--------+         Summary:  Right:  - No evidence of deep vein thrombosis seen in the right lower extremity,  from the common femoral through the popliteal veins.  - No evidence of superficial venous thrombosis in the right lower  extremity.    - No evidence of superficial venous reflux seen in the right short  saphenous vein.    - Venous reflux is noted in the right common femoral vein.  - Venous reflux is noted in the right sapheno-femoral junction.    - Incidental finding Right medial mid thigh hypoechoic structure without  vascularity measuirng 1.3cm x 0.5cm x 0.98cm adjacent to Right GSV.  Etiology is unknown. Clinical correlation is advised if clincally  indicated.    *See table(s) above for measurements and observations.   Electronically signed by Heath Lark on 02/08/2023 at 5:07:12 PM.      Assessment/Plan:  52 y.o. female, with history of hyperlipidemia that presents for evaluation of varicose veins with numbness.   Cephus Shelling, MD [facility] Office: [phone]

## 2023-03-01 ENCOUNTER — Ambulatory Visit (INDEPENDENT_AMBULATORY_CARE_PROVIDER_SITE_OTHER): Payer: Medicaid Other | Admitting: Vascular Surgery

## 2023-03-01 ENCOUNTER — Encounter: Payer: Self-pay | Admitting: Vascular Surgery

## 2023-03-01 VITALS — BP 104/71 | HR 72 | Temp 98.2°F | Ht 64.5 in | Wt 207.1 lb

## 2023-03-01 DIAGNOSIS — I8393 Asymptomatic varicose veins of bilateral lower extremities: Secondary | ICD-10-CM

## 2023-03-01 DIAGNOSIS — I872 Venous insufficiency (chronic) (peripheral): Secondary | ICD-10-CM | POA: Diagnosis not present

## 2023-03-07 ENCOUNTER — Encounter: Payer: Self-pay | Admitting: Family Medicine

## 2023-03-07 NOTE — Telephone Encounter (Signed)
Patient can call [facility] [place]# [phone]  [facility] #[phone]

## 2023-03-08 NOTE — Telephone Encounter (Signed)
Contacted pt. Information was given by phone. She was thankful. :)

## 2023-04-12 ENCOUNTER — Other Ambulatory Visit: Payer: Self-pay

## 2023-04-12 MED ORDER — ROSUVASTATIN CALCIUM 5 MG PO TABS: 5.0000 mg | ORAL_TABLET | Freq: Every day | ORAL | 1 refills | Status: DC

## 2023-05-11 NOTE — Patient Instructions (Signed)

## 2023-05-11 NOTE — Progress Notes (Unsigned)
   Established Patient Office Visit   Subjective  Patient ID: Shakira Los, female    DOB: Apr 03, 1971  Age: 53 y.o. MRN: [id number]  No chief complaint on file.   She  has a past medical history of Hair loss and Hyperlipidemia.  HPI  ROS    Objective:     There were no vitals taken for this visit. {Vitals History (Optional):23777}  Physical Exam   No results found for any visits on 05/13/23.  The 10-year ASCVD risk score (Arnett DK, et al., 2019) is: 7.7%    Assessment & Plan:  There are no diagnoses linked to this encounter.  Return in about 8 weeks (around 07/08/2023), or if symptoms worsen or fail to improve, for Pap smear.   Hilario Kidd Wilhelmena Falter, FNP

## 2023-05-13 ENCOUNTER — Ambulatory Visit: Payer: No Typology Code available for payment source | Admitting: Family Medicine

## 2023-05-13 DIAGNOSIS — R7301 Impaired fasting glucose: Secondary | ICD-10-CM

## 2023-05-13 DIAGNOSIS — Z114 Encounter for screening for human immunodeficiency virus [HIV]: Secondary | ICD-10-CM

## 2023-05-13 DIAGNOSIS — E038 Other specified hypothyroidism: Secondary | ICD-10-CM

## 2023-05-13 DIAGNOSIS — Z1159 Encounter for screening for other viral diseases: Secondary | ICD-10-CM

## 2023-05-13 DIAGNOSIS — E782 Mixed hyperlipidemia: Secondary | ICD-10-CM

## 2023-05-13 DIAGNOSIS — Z1211 Encounter for screening for malignant neoplasm of colon: Secondary | ICD-10-CM

## 2023-05-13 DIAGNOSIS — E669 Obesity, unspecified: Secondary | ICD-10-CM

## 2023-05-19 IMAGING — MG MM DIGITAL SCREENING BILAT W/ TOMO AND CAD
8 series · 8 of 24 positions shown · non-contrast
Comparison: Previous exam(s).

CLINICAL DATA: Screening.

EXAM:
DIGITAL SCREENING BILATERAL MAMMOGRAM WITH TOMOSYNTHESIS AND CAD
TECHNIQUE: Bilateral screening digital craniocaudal and mediolateral oblique
mammograms were obtained. Bilateral screening digital breast
tomosynthesis was performed. The images were evaluated with
computer-aided detection.

[L MLO synth-2D]
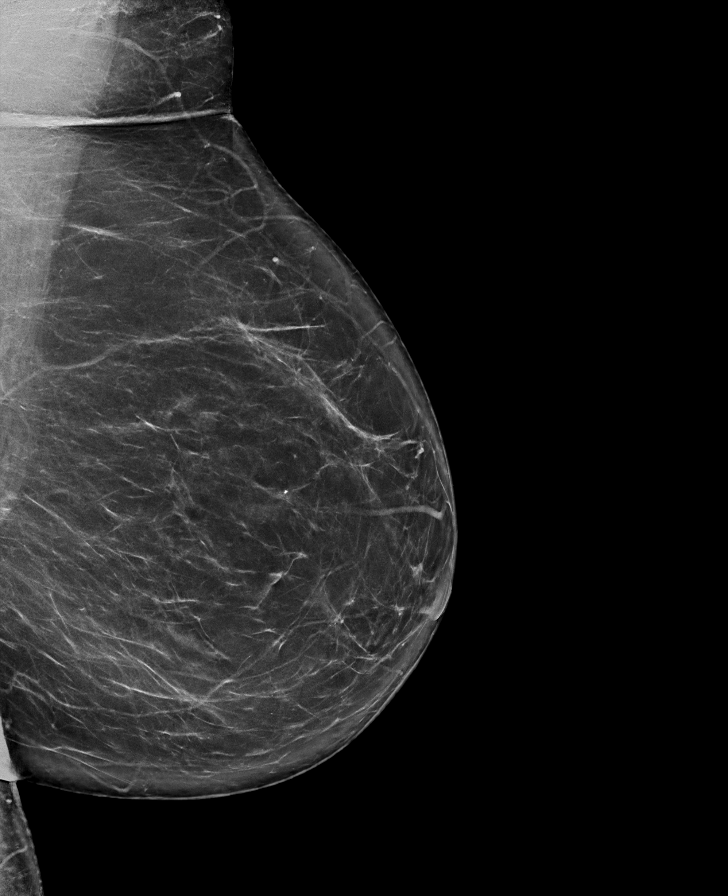

[L CC synth-2D]
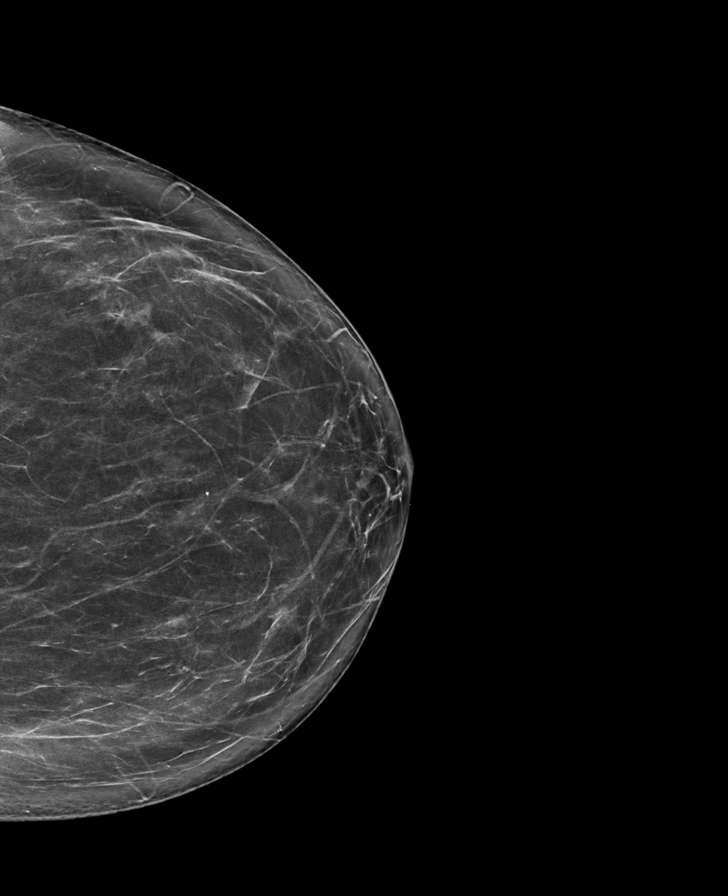

[R CC synth-2D]
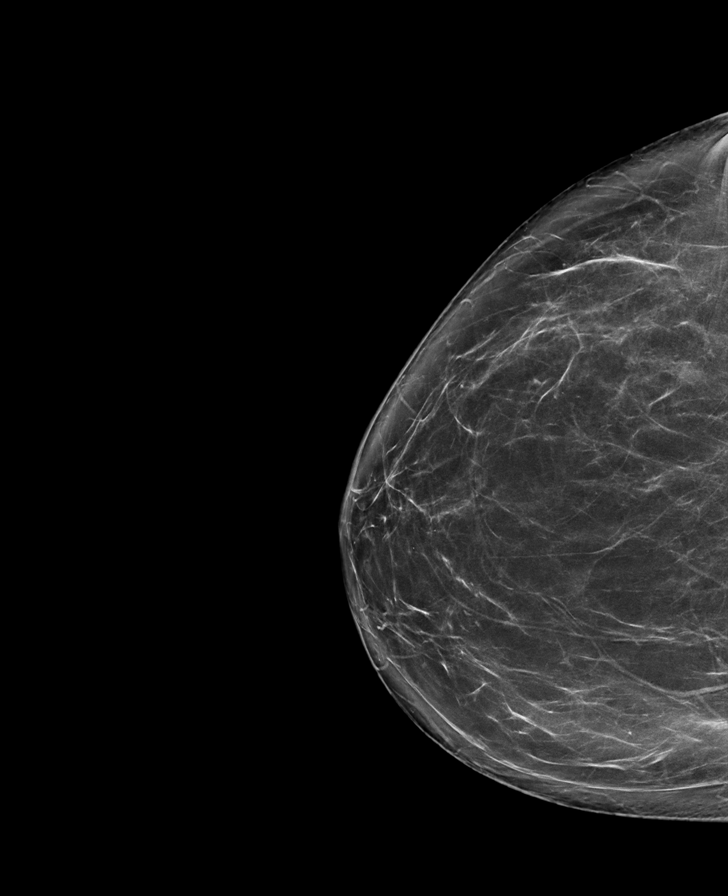

[R MLO synth-2D]
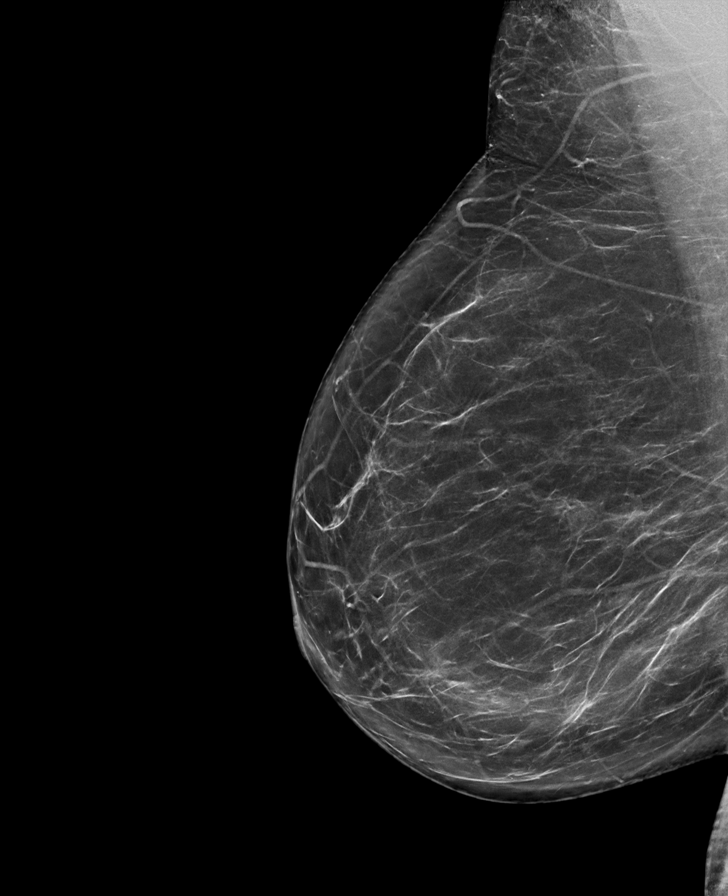

[L MLO tomo · tomo slice 45/88.0]
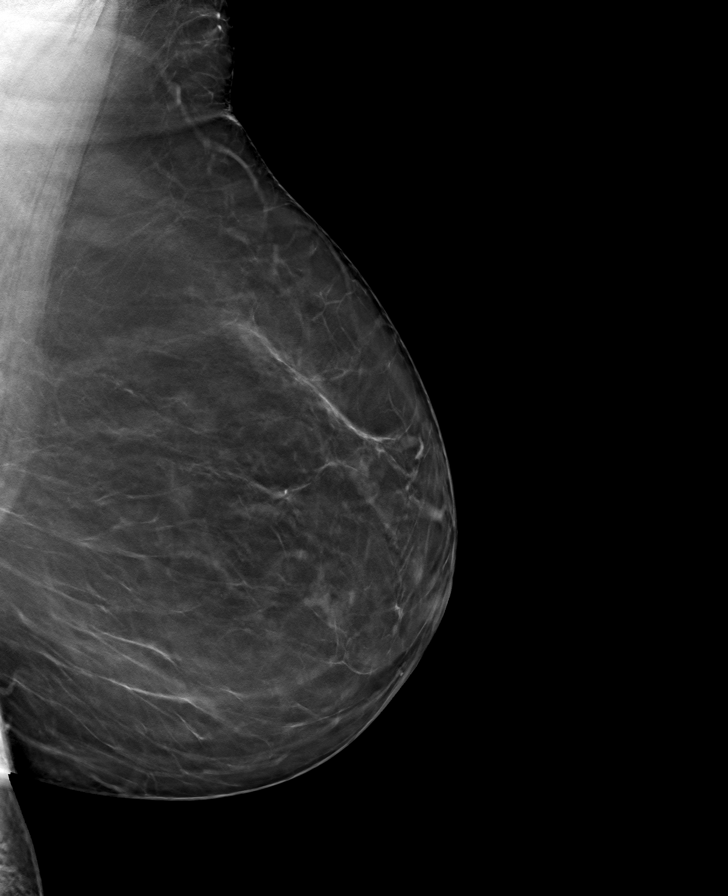

[R MLO tomo · tomo slice 44/87.0]
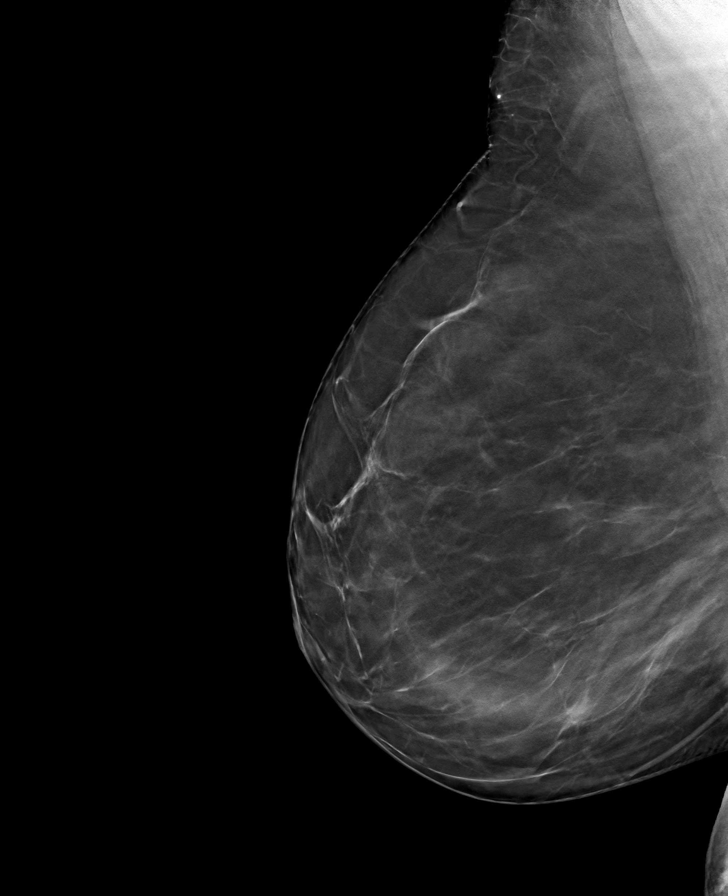

[L CC tomo · tomo slice 41/81.0]
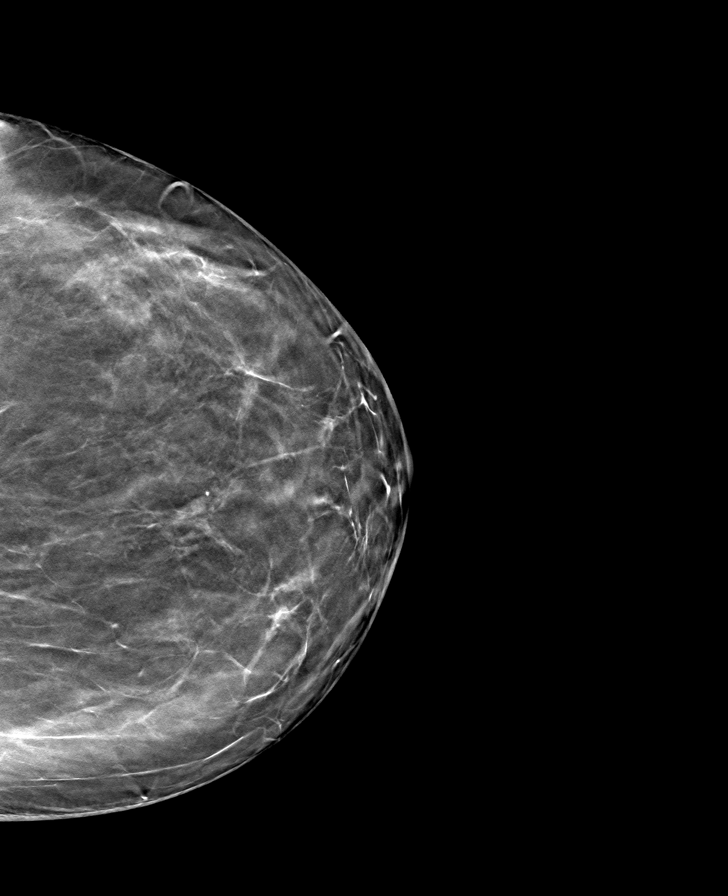

[R CC tomo · tomo slice 43/85.0]
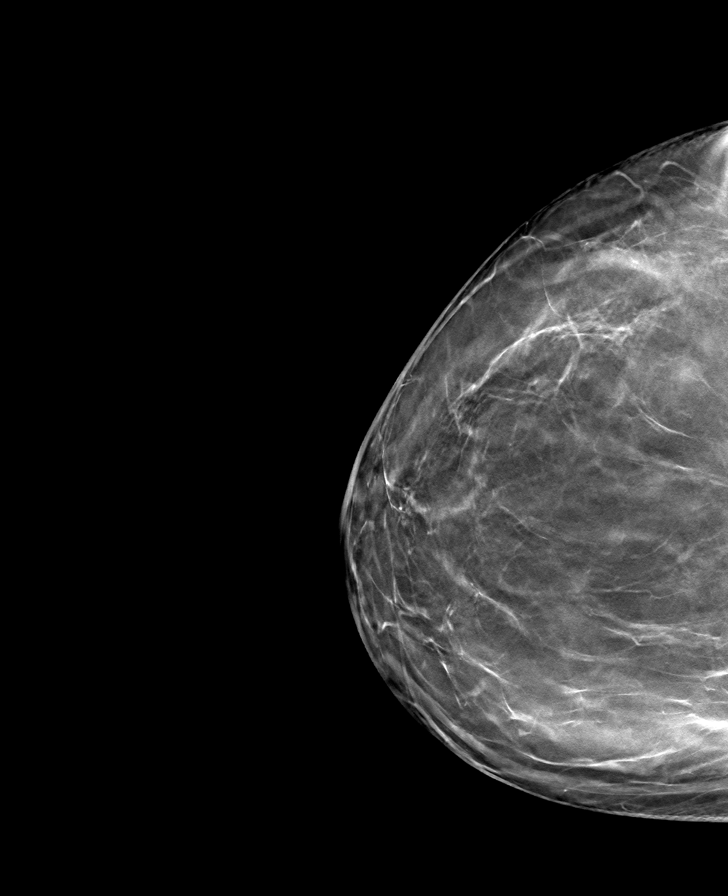

[8 of 24 positions shown; findings below may reference images not displayed]

ACR Breast Density Category b: There are scattered areas of
fibroglandular density.
FINDINGS: There are no findings suspicious for malignancy.
IMPRESSION: No mammographic evidence of malignancy. A result letter of this
screening mammogram will be mailed directly to the patient.

RECOMMENDATION:
Screening mammogram in one year. (Code:51-O-LD2)

BI-RADS CATEGORY  1: Negative.

## 2023-05-24 ENCOUNTER — Encounter: Payer: Self-pay | Admitting: Family Medicine

## 2023-06-08 ENCOUNTER — Ambulatory Visit: Payer: No Typology Code available for payment source | Admitting: Family Medicine

## 2023-06-09 ENCOUNTER — Other Ambulatory Visit: Payer: Self-pay | Admitting: Family Medicine

## 2023-06-09 MED ORDER — FENOFIBRATE 145 MG PO TABS: 145.0000 mg | ORAL_TABLET | Freq: Every day | ORAL | 4 refills | Status: DC

## 2023-06-23 ENCOUNTER — Encounter: Payer: Self-pay | Admitting: Family Medicine

## 2023-06-23 ENCOUNTER — Ambulatory Visit: Payer: No Typology Code available for payment source | Admitting: Family Medicine

## 2023-06-23 VITALS — BP 108/73 | HR 77 | Resp 16 | Ht 63.0 in | Wt 211.8 lb

## 2023-06-23 DIAGNOSIS — E559 Vitamin D deficiency, unspecified: Secondary | ICD-10-CM

## 2023-06-23 DIAGNOSIS — E038 Other specified hypothyroidism: Secondary | ICD-10-CM | POA: Diagnosis not present

## 2023-06-23 DIAGNOSIS — D509 Iron deficiency anemia, unspecified: Secondary | ICD-10-CM

## 2023-06-23 DIAGNOSIS — R7301 Impaired fasting glucose: Secondary | ICD-10-CM | POA: Diagnosis not present

## 2023-06-23 DIAGNOSIS — M25572 Pain in left ankle and joints of left foot: Secondary | ICD-10-CM | POA: Insufficient documentation

## 2023-06-23 DIAGNOSIS — Z136 Encounter for screening for cardiovascular disorders: Secondary | ICD-10-CM | POA: Insufficient documentation

## 2023-06-23 DIAGNOSIS — E2839 Other primary ovarian failure: Secondary | ICD-10-CM

## 2023-06-23 DIAGNOSIS — Z1211 Encounter for screening for malignant neoplasm of colon: Secondary | ICD-10-CM

## 2023-06-23 DIAGNOSIS — Z114 Encounter for screening for human immunodeficiency virus [HIV]: Secondary | ICD-10-CM

## 2023-06-23 DIAGNOSIS — Z1159 Encounter for screening for other viral diseases: Secondary | ICD-10-CM

## 2023-06-23 NOTE — Assessment & Plan Note (Signed)

## 2023-06-23 NOTE — Progress Notes (Signed)
 Established Patient Office Visit   Subjective  Patient ID: Jessica Kennedy, female    DOB: Sep 27, 1970  Age: 53 y.o. MRN: [id number]  Chief Complaint  Patient presents with   Hyperlipidemia    Follow up   Ankle Pain    Twisted her left ankle yesterday and it swelled up and is very tender to the touch on the top part.    Weight Loss    Wants to know if her new insurance will cover the weight loss drugs     She  has a past medical history of Hair loss and Hyperlipidemia.  Ankle Pain Patient reports left ankle pain following a twisting injury that occurred at work. The pain is described as aching in nature, with a severity of 8 out of 10. Since onset, the pain has been fluctuating. She also notes associated muscle weakness. She denies any inability to bear weight, numbness, tingling, or the presence of foreign bodies. The pain is worsened by movement, palpation, and weight-bearing activities. She has attempted NSAIDS rest and limiting weight-bearing, which has provided moderate relief.    Review of Systems  Constitutional:  Negative for fever.  Eyes:  Negative for blurred vision.  Respiratory:  Negative for shortness of breath.   Cardiovascular:  Negative for chest pain.  Gastrointestinal:  Negative for abdominal pain, constipation and diarrhea.  Genitourinary:  Negative for dysuria.  Neurological:  Negative for dizziness and headaches.      Objective:     BP 108/73   Pulse 77   Resp 16   Ht 5\' 3"  (1.6 m)   Wt 211 lb 12.8 oz (96.1 kg)   SpO2 99%   BMI 37.52 kg/m  BP Readings from Last 3 Encounters:  06/23/23 108/73  03/01/23 104/71  02/07/23 116/76      Physical Exam Vitals reviewed.  Constitutional:      General: She is not in acute distress.    Appearance: Normal appearance. She is not ill-appearing, toxic-appearing or diaphoretic.  HENT:     Head: Normocephalic.  Eyes:     General:        Right eye: No discharge.        Left eye: No discharge.      Conjunctiva/sclera: Conjunctivae normal.  Cardiovascular:     Rate and Rhythm: Normal rate.     Pulses: Normal pulses.     Heart sounds: Normal heart sounds.  Pulmonary:     Effort: Pulmonary effort is normal. No respiratory distress.     Breath sounds: Normal breath sounds.  Abdominal:     General: Bowel sounds are normal.     Palpations: Abdomen is soft.     Tenderness: There is no abdominal tenderness. There is no right CVA tenderness, left CVA tenderness or guarding.  Musculoskeletal:        General: Normal range of motion.     Right ankle: No tenderness. Normal range of motion.     Left ankle: Tenderness present. Normal range of motion.  Skin:    General: Skin is warm and dry.     Capillary Refill: Capillary refill takes less than 2 seconds.  Neurological:     Mental Status: She is alert.     Coordination: Coordination normal.     Gait: Gait normal.  Psychiatric:        Mood and Affect: Mood normal.        Behavior: Behavior normal.      No results found for any visits  on 06/23/23.  The 10-year ASCVD risk score (Arnett DK, et al., 2019) is: 8.2%    Assessment & Plan:  Encounter for screening for cardiovascular disorders Assessment & Plan: A comprehensive physical examination was completed, and necessary labs were ordered. Screening and health maintenance recommendations have been updated. The patient received counseling on exercise and nutrition. BMI was assessed and discussed Advise for heart health, focus on: Eat more fruits and vegetables: Aim for a variety of colors. Choose whole grains: Brown rice, oats, and whole-wheat bread. Limit unhealthy fats: Avoid trans fats; use olive or avocado oil instead. Include lean proteins: Opt for fish, chicken, beans, and legumes. Reduce sodium: Limit processed foods and add less salt. Stay hydrated: Drink plenty of water. Exercise regularly: Aim for at least 30 minutes of moderate exercise, like walking or cycling, 5 days a  week.    Orders: -     Lipid panel -     CMP14+EGFR -     CBC with Differential/Platelet  Screening for colon cancer -     Fecal occult blood, imunochemical  Need for hepatitis C screening test -     Hepatitis C antibody  Vitamin D deficiency -     VITAMIN D 25 Hydroxy (Vit-D Deficiency, Fractures)  Screening for HIV (human immunodeficiency virus) -     HIV Antibody (routine testing w rflx)  TSH (thyroid-stimulating hormone deficiency) -     TSH + free T4  IFG (impaired fasting glucose) -     Hemoglobin A1c  Estrogen deficiency -     Estrogens, total  Acute left ankle pain Assessment & Plan: Xray ordered Advise icing, stretching and modifying activities that cause pain. and NSAIDs for pain management. Discussed theThe RICE method: R: Rest the painful area for a few days. I: Ice the area for 20 minutes at a time to relieve inflammation. C: Compress the area with a soft wrap to reduce swelling. E: Elevate the area by putting the foot on a few pillows.   Orders: -     DG Ankle Complete Left; Future  Iron deficiency anemia, unspecified iron deficiency anemia type -     Iron, TIBC and Ferritin Panel -     Vitamin B12    Return in about 6 months (around 12/24/2023), or weight loss managment, for hyperlipidemia.   Cruzita Lederer Newman Nip, FNP

## 2023-06-23 NOTE — Assessment & Plan Note (Signed)
 Xray ordered Advise icing, stretching and modifying activities that cause pain. and NSAIDs for pain management. Discussed theThe RICE method: R: Rest the painful area for a few days. I: Ice the area for 20 minutes at a time to relieve inflammation. C: Compress the area with a soft wrap to reduce swelling. E: Elevate the area by putting the foot on a few pillows.

## 2023-06-23 NOTE — Patient Instructions (Addendum)
        Great to see you today.  I have refilled the medication(s) we provide.    Current approved weight loss only not type 2 diabetes injections include:  Wegovy   Zepbound (tirzepatide)  Saxenda (liraglutide)  Oral:  Phentermine    If labs were collected, we will inform you of lab results once received either by echart message or telephone call.   - echart message- for normal results that have been seen by the patient already.   - telephone call: abnormal results or if patient has not viewed results in their echart.   - Please take medications as prescribed. - Follow up with your primary health provider if any health concerns arises. - If symptoms worsen please contact your primary care provider and/or visit the emergency department.

## 2023-06-24 ENCOUNTER — Encounter: Payer: Self-pay | Admitting: Family Medicine

## 2023-06-24 ENCOUNTER — Other Ambulatory Visit: Payer: Self-pay | Admitting: Family Medicine

## 2023-06-24 MED ORDER — ROSUVASTATIN CALCIUM 20 MG PO TABS: 20.0000 mg | ORAL_TABLET | Freq: Every day | ORAL | 3 refills | Status: AC

## 2023-06-25 LAB — CBC WITH DIFFERENTIAL/PLATELET
Basophils Absolute: 0.1 10*3/uL (ref 0.0–0.2)
Basos: 1 %
EOS (ABSOLUTE): 0.2 10*3/uL (ref 0.0–0.4)
Eos: 2 %
Hematocrit: 40.7 % (ref 34.0–46.6)
Hemoglobin: 13.4 g/dL (ref 11.1–15.9)
Immature Grans (Abs): 0 10*3/uL (ref 0.0–0.1)
Immature Granulocytes: 1 %
Lymphocytes Absolute: 2.6 10*3/uL (ref 0.7–3.1)
Lymphs: 31 %
MCH: 32.2 pg (ref 26.6–33.0)
MCHC: 32.9 g/dL (ref 31.5–35.7)
MCV: 98 fL — ABNORMAL HIGH (ref 79–97)
Monocytes Absolute: 0.5 10*3/uL (ref 0.1–0.9)
Monocytes: 6 %
Neutrophils Absolute: 4.8 10*3/uL (ref 1.4–7.0)
Neutrophils: 59 %
Platelets: 311 10*3/uL (ref 150–450)
RBC: 4.16 x10E6/uL (ref 3.77–5.28)
RDW: 12.2 % (ref 11.7–15.4)
WBC: 8.1 10*3/uL (ref 3.4–10.8)

## 2023-06-25 LAB — CMP14+EGFR
ALT: 15 IU/L (ref 0–32)
AST: 20 IU/L (ref 0–40)
Albumin: 4.3 g/dL (ref 3.8–4.9)
Alkaline Phosphatase: 66 IU/L (ref 44–121)
BUN/Creatinine Ratio: 23 (ref 9–23)
BUN: 19 mg/dL (ref 6–24)
Bilirubin Total: 0.3 mg/dL (ref 0.0–1.2)
CO2: 19 mmol/L — ABNORMAL LOW (ref 20–29)
Calcium: 9.4 mg/dL (ref 8.7–10.2)
Chloride: 104 mmol/L (ref 96–106)
Creatinine, Ser: 0.82 mg/dL (ref 0.57–1.00)
Globulin, Total: 2.4 g/dL (ref 1.5–4.5)
Glucose: 82 mg/dL (ref 70–99)
Potassium: 4.7 mmol/L (ref 3.5–5.2)
Sodium: 136 mmol/L (ref 134–144)
Total Protein: 6.7 g/dL (ref 6.0–8.5)
eGFR: 87 mL/min/{1.73_m2} (ref >59)

## 2023-06-25 LAB — HEMOGLOBIN A1C
Est. average glucose Bld gHb Est-mCnc: 114 mg/dL
Hgb A1c MFr Bld: 5.6 % (ref 4.8–5.6)

## 2023-06-25 LAB — LIPID PANEL
Chol/HDL Ratio: 5.5 ratio — ABNORMAL HIGH (ref 0.0–4.4)
Cholesterol, Total: 218 mg/dL — ABNORMAL HIGH (ref 100–199)
HDL: 40 mg/dL (ref >39)
LDL Chol Calc (NIH): 112 mg/dL — ABNORMAL HIGH (ref 0–99)
Triglycerides: 383 mg/dL — ABNORMAL HIGH (ref 0–149)
VLDL Cholesterol Cal: 66 mg/dL — ABNORMAL HIGH (ref 5–40)

## 2023-06-25 LAB — IRON,TIBC AND FERRITIN PANEL
Ferritin: 229 ng/mL — ABNORMAL HIGH (ref 15–150)
Iron Saturation: 20 % (ref 15–55)
Iron: 80 ug/dL (ref 27–159)
Total Iron Binding Capacity: 397 ug/dL (ref 250–450)
UIBC: 317 ug/dL (ref 131–425)

## 2023-06-25 LAB — HIV ANTIBODY (ROUTINE TESTING W REFLEX): HIV Screen 4th Generation wRfx: NONREACTIVE

## 2023-06-25 LAB — VITAMIN B12: Vitamin B-12: 410 pg/mL (ref 232–1245)

## 2023-06-25 LAB — TSH+FREE T4
Free T4: 1.16 ng/dL (ref 0.82–1.77)
TSH: 0.959 u[IU]/mL (ref 0.450–4.500)

## 2023-06-25 LAB — VITAMIN D 25 HYDROXY (VIT D DEFICIENCY, FRACTURES): Vit D, 25-Hydroxy: 23.6 ng/mL — ABNORMAL LOW (ref 30.0–100.0)

## 2023-06-25 LAB — ESTROGENS, TOTAL: Estrogen: 158 pg/mL

## 2023-06-25 LAB — HEPATITIS C ANTIBODY: Hep C Virus Ab: NONREACTIVE

## 2023-06-27 ENCOUNTER — Encounter: Payer: Self-pay | Admitting: Family Medicine

## 2023-07-27 ENCOUNTER — Ambulatory Visit: Admitting: Family Medicine

## 2023-08-02 ENCOUNTER — Encounter: Payer: Self-pay | Admitting: Family Medicine

## 2023-08-10 ENCOUNTER — Other Ambulatory Visit: Payer: Self-pay | Admitting: Family Medicine

## 2023-08-11 MED ORDER — VITAMIN D (ERGOCALCIFEROL) 1.25 MG (50000 UNIT) PO CAPS
50000.0000 [IU] | ORAL_CAPSULE | ORAL | 2 refills | Status: AC
  Filled 2023-08-11 – 2023-09-14 (×2): qty 12, 84d supply, fill #0
  Filled 2023-12-02: qty 12, 84d supply, fill #1
  Filled 2024-04-06: qty 12, 84d supply, fill #2

## 2023-08-12 ENCOUNTER — Other Ambulatory Visit (HOSPITAL_COMMUNITY): Payer: Self-pay

## 2023-08-12 ENCOUNTER — Other Ambulatory Visit: Payer: Self-pay

## 2023-08-17 ENCOUNTER — Other Ambulatory Visit: Payer: Self-pay

## 2023-09-14 ENCOUNTER — Other Ambulatory Visit (HOSPITAL_COMMUNITY): Payer: Self-pay

## 2023-09-14 ENCOUNTER — Other Ambulatory Visit: Payer: Self-pay

## 2023-09-16 ENCOUNTER — Ambulatory Visit: Admitting: Family Medicine

## 2023-09-16 VITALS — BP 104/71 | HR 56 | Ht 63.0 in | Wt 212.2 lb

## 2023-09-16 DIAGNOSIS — M545 Low back pain, unspecified: Secondary | ICD-10-CM | POA: Diagnosis not present

## 2023-09-16 MED ORDER — CYCLOBENZAPRINE HCL 5 MG PO TABS: 5.0000 mg | ORAL_TABLET | Freq: Two times a day (BID) | ORAL | 2 refills | Status: AC | PRN

## 2023-09-16 MED ORDER — PREDNISONE 20 MG PO TABS: 20.0000 mg | ORAL_TABLET | Freq: Two times a day (BID) | ORAL | 0 refills | Status: AC

## 2023-09-16 NOTE — Progress Notes (Signed)
 Established Patient Office Visit   Subjective  Patient ID: Jessica Kennedy, female    DOB: [birth year]  Age: 53 y.o. MRN: [id number]  Chief Complaint  Patient presents with   Mass    Under left underarm    Pain    Fallen 3 weeks ago,Located in the lower back, right side, pain located in the pelvic area, lower back, pain is triggered by daily activities, varies in intensity, pain is constant, pain has become worse since injury    She  has a past medical history of Hair loss and Hyperlipidemia.  Back Pain - Chronic with Acute Exacerbation  The patient presents with chronic low back pain, acutely worsened following a fall approximately three weeks ago. The pain is constant and has progressively worsened over time. It is localized to the lumbar spine and sacroiliac region, with an aching quality. The pain radiates to the right hip and leg and is pain rated 9/10. Pain is worse during the day and is aggravated by movement, particularly bending and walking. Associated symptoms include leg pain, numbness, tingling, and weakness. The patient denies chest pain or fever. Relevant risk factors include obesity and recent trauma. She has attempted conservative measures such as bed rest and ambulation, without relief.      Review of Systems  Constitutional:  Negative for chills and fever.  Respiratory:  Negative for shortness of breath.   Cardiovascular:  Negative for chest pain.  Musculoskeletal:  Positive for back pain.  Neurological:  Positive for tingling and weakness.      Objective:     BP 104/71 (BP Location: Left Arm, Patient Position: Sitting, Cuff Size: Large)   Pulse (!) 56   Ht 5' 3 (1.6 m)   Wt 212 lb 3.2 oz (96.3 kg)   BMI 37.59 kg/m  BP Readings from Last 3 Encounters:  09/16/23 104/71  06/23/23 108/73  03/01/23 104/71      Physical Exam Vitals reviewed.  Constitutional:      General: She is not in acute distress.    Appearance: Normal appearance. She is not  ill-appearing, toxic-appearing or diaphoretic.  HENT:     Head: Normocephalic.   Eyes:     General:        Right eye: No discharge.        Left eye: No discharge.     Conjunctiva/sclera: Conjunctivae normal.    Cardiovascular:     Rate and Rhythm: Normal rate.     Pulses: Normal pulses.     Heart sounds: Normal heart sounds.  Pulmonary:     Effort: Pulmonary effort is normal. No respiratory distress.     Breath sounds: Normal breath sounds.  Abdominal:     General: Bowel sounds are normal.   Musculoskeletal:     Thoracic back: Decreased range of motion.     Lumbar back: Decreased range of motion. Positive right straight leg raise test and positive left straight leg raise test.   Skin:    General: Skin is warm and dry.     Capillary Refill: Capillary refill takes less than 2 seconds.   Neurological:     Mental Status: She is alert.     Coordination: Coordination normal.     Gait: Gait normal.   Psychiatric:        Mood and Affect: Mood normal.        Behavior: Behavior normal.      No results found for any visits on 09/16/23.  The 10-year ASCVD  risk score (Arnett DK, et al., 2019) is: 4.1%    Assessment & Plan:  Lumbar back pain Assessment & Plan: Xray Ordered- awaiting results will follow up.  Trial Flexeril 5 mg PRN Discussed to focus on maintaining good posture, using lumbar support while sitting, and avoiding prolonged sitting or heavy lifting. Engage in low-impact exercises like walking or swimming to strengthen core muscles and reduce strain on the spine. Apply heat or ice packs as needed for pain relief and consider physical therapy for targeted exercises.   Orders: -     predniSONE; Take 1 tablet (20 mg total) by mouth 2 (two) times daily with a meal for 5 days.  Dispense: 10 tablet; Refill: 0 -     DG Lumbar Spine 2-3 Views; Future -     Cyclobenzaprine HCl; Take 1 tablet (5 mg total) by mouth 2 (two) times daily as needed.  Dispense: 60 tablet; Refill:  2    No follow-ups on file.   Avelino Lek Amber Bail, FNP

## 2023-09-16 NOTE — Patient Instructions (Signed)

## 2023-09-16 NOTE — Assessment & Plan Note (Signed)
 Xray Ordered- awaiting results will follow up.  Trial Flexeril 5 mg PRN Discussed to focus on maintaining good posture, using lumbar support while sitting, and avoiding prolonged sitting or heavy lifting. Engage in low-impact exercises like walking or swimming to strengthen core muscles and reduce strain on the spine. Apply heat or ice packs as needed for pain relief and consider physical therapy for targeted exercises.

## 2023-09-21 ENCOUNTER — Other Ambulatory Visit: Payer: Self-pay

## 2023-09-21 ENCOUNTER — Ambulatory Visit (HOSPITAL_COMMUNITY)
Admission: RE | Admit: 2023-09-21 | Discharge: 2023-09-21 | Disposition: A | Discharge status: Home / Self Care | Source: Ambulatory Visit | Attending: Family Medicine | Admitting: Family Medicine

## 2023-09-21 DIAGNOSIS — M545 Low back pain, unspecified: Secondary | ICD-10-CM | POA: Diagnosis present

## 2023-09-21 MED ORDER — FENOFIBRATE 145 MG PO TABS: 145.0000 mg | ORAL_TABLET | Freq: Every day | ORAL | 0 refills | Status: DC

## 2023-09-22 ENCOUNTER — Ambulatory Visit: Payer: Self-pay | Admitting: Family Medicine

## 2023-09-27 ENCOUNTER — Other Ambulatory Visit: Payer: Self-pay | Admitting: Family Medicine

## 2023-09-27 ENCOUNTER — Ambulatory Visit: Payer: Self-pay | Admitting: Family Medicine

## 2023-09-27 ENCOUNTER — Encounter: Payer: Self-pay | Admitting: Family Medicine

## 2023-09-27 DIAGNOSIS — M51362 Other intervertebral disc degeneration, lumbar region with discogenic back pain and lower extremity pain: Secondary | ICD-10-CM

## 2023-09-27 NOTE — Telephone Encounter (Signed)
 Your vitamin B12 levels are within the normal range, so no supplementation is needed at this time

## 2023-11-29 ENCOUNTER — Ambulatory Visit (INDEPENDENT_AMBULATORY_CARE_PROVIDER_SITE_OTHER): Payer: No Typology Code available for payment source | Admitting: Dermatology

## 2023-11-29 ENCOUNTER — Encounter: Payer: Self-pay | Admitting: Dermatology

## 2023-11-29 VITALS — BP 121/81 | HR 57

## 2023-11-29 DIAGNOSIS — L649 Androgenic alopecia, unspecified: Secondary | ICD-10-CM

## 2023-11-29 DIAGNOSIS — L918 Other hypertrophic disorders of the skin: Secondary | ICD-10-CM | POA: Diagnosis not present

## 2023-11-29 DIAGNOSIS — L811 Chloasma: Secondary | ICD-10-CM

## 2023-11-29 MED ORDER — SAFETY SEAL MISCELLANEOUS MISC: 1.0000 | Freq: Every evening | 6 refills | Status: AC

## 2023-11-29 MED ORDER — MINOXIDIL 2.5 MG PO TABS: 2.5000 mg | ORAL_TABLET | Freq: Every day | ORAL | 9 refills | Status: AC

## 2023-11-29 MED ORDER — SAFETY SEAL MISCELLANEOUS MISC: 1.0000 | Freq: Every morning | 9 refills | Status: AC

## 2023-11-29 NOTE — Progress Notes (Signed)
 New Patient Visit   Subjective  Jessica Kennedy is a 53 y.o. female who presents for the following: Hair loss   Patient states she has hair loss on her scalp & hyperpigmentation located at the face that she would like to have examined. Patient reports the areas have been there for 5 months. She reports the areas are not bothersome.Patient reports she recently had labs completed by pcp. She reports she had low iron, vit d and she is also going through peri-menopause. Patient reports she has previously been treated for these areas. She was prescribed Minoxidil  2.5 mg taken daily. She has been out since March. She was also prescribed oral Spironolactone 100mg  to take daily. She just ran out last week. Patient denies Hx of bx. Patient reports family history of hair loss and thinning on her dad's side.   Patient states she has hyperpigmentation located at the face that she would like to have examined. Patient reports the areas have been there for 10 months. She reports the areas are not bothersome. Patient reports she has not previously been treated for these areas.   The following portions of the chart were reviewed this encounter and updated as appropriate: medications, allergies, medical history  Review of Systems:  No other skin or systemic complaints except as noted in HPI or Assessment and Plan.  Objective  Well appearing patient in no apparent distress; mood and affect are within normal limits.  A focused examination was performed of the following areas: Scalp and face  Relevant exam findings are noted in the Assessment and Plan.                       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 Significant hair thinning noticed by 2023. Previously treated with oral minoxidil  for 10 months which stopped shedding but did not promote regrowth. Patient discontinued  minoxidil  4 months ago without worsening shedding. Oral spironolactone 100 mg daily used for about a year with last dose one week ago. Patient has low vitamin D , arginine, and is anemic discovered in March 2025.  Female Androgenic Alopecia is a chronic condition related to genetics and/or hormonal changes.  In women androgenetic alopecia is commonly associated with menopause but may occur any time after puberty.  It causes hair thinning primarily on the crown with widening of the part and temporal hairline recession.     Long term medication management.  Patient is using long term (months to years) prescription medication  to control their dermatologic condition.  These medications require periodic monitoring to evaluate for efficacy and side effects and may require periodic laboratory monitoring.      - Plan:    Restart oral minoxidil     Add topical finasteride    Continue iron, vitamin D , and collagen supplements    Start Viviscal for hair loss    Primary care to recheck labs in September    Follow-up in 6 months to monitor progress   2. Melasma - Assessment: Patient presents with newly noticed dark spots on face consistent with melasma. Condition brought to patient's attention by her mother. - Plan:    Use sunscreen daily    Start hydroquinone 6% at night alternating with retinol  3. Skin Tags - Assessment: Patient has skin tags which may be related to weight gain and insulin resistance. - Plan:    Continue using retinol  Follow-up in 6 months to monitor progress.    ANDROGENETIC ALOPECIA   MELASMA    Return in about 6 months (around 05/31/2024) for Androgenetic Alopecia & Melasma F/U.  I, Jetta Ager, am acting as Neurosurgeon for [employer], DO.  Documentation: I have reviewed the above documentation for accuracy and completeness, and I agree with the above.  Delon Lenis, DO

## 2023-11-29 NOTE — Patient Instructions (Addendum)
 Date: Tue Nov 29 2023  Hello Jessica Kennedy,  Thank you for visiting today. Here is a summary of the key instructions:  - Medications:   - Restart oral minoxidil  for hair loss   - Resume spironolactone 100 mg daily   - Start topical finasteride for hair loss   - Start Viviscal supplement for hair loss   - Use hydroquinone 6% at night for melasma, alternating with retinol   - Continue iron supplements   - Continue vitamin D  supplements   - Continue collagen supplements  - Skin Care:   - Use sunscreen with SPF 30 or higher daily   - Continue using retinol for skin tags  - Follow-up:   - Return for a follow-up appointment in 6 months  Please reach out if you have any questions or concerns.  Warm regards,  Dr. Delon Lenis [facility]              Important Information   Due to recent changes in healthcare laws, you may see results of your pathology and/or laboratory studies on MyChart before the doctors have had a chance to review them. We understand that in some cases there may be results that are confusing or concerning to you. Please understand that not all results are received at the same time and often the doctors may need to interpret multiple results in order to provide you with the best plan of care or course of treatment. Therefore, we ask that you please give us  2 business days to thoroughly review all your results before contacting the office for clarification. Should we see a critical lab result, you will be contacted sooner.     If You Need Anything After Your Visit   If you have any questions or concerns for your doctor, please call our main line at [phone]. If no one answers, please leave a voicemail as directed and we will return your call as soon as possible. Messages left after 4 pm will be answered the following business day.    You may also send us  a message via MyChart. We typically respond to MyChart messages within 1-2 business days.  For  prescription refills, please ask your pharmacy to contact our office. Our fax number is [phone].  If you have an urgent issue when the clinic is closed that cannot wait until the next business day, you can page your doctor at the number below.     Please note that while we do our best to be available for urgent issues outside of office hours, we are not available 24/7.    If you have an urgent issue and are unable to reach us , you may choose to seek medical care at your doctor's office, retail clinic, urgent care center, or emergency room.   If you have a medical emergency, please immediately call 911 or go to the emergency department. In the event of inclement weather, please call our main line at [phone] for an update on the status of any delays or closures.  Dermatology Medication Tips: Please keep the boxes that topical medications come in in order to help keep track of the instructions about where and how to use these. Pharmacies typically print the medication instructions only on the boxes and not directly on the medication tubes.   If your medication is too expensive, please contact our office at [phone] or send us  a message through MyChart.    We are unable to tell what your co-pay for medications will  be in advance as this is different depending on your insurance coverage. However, we may be able to find a substitute medication at lower cost or fill out paperwork to get insurance to cover a needed medication.    If a prior authorization is required to get your medication covered by your insurance company, please allow us  1-2 business days to complete this process.   Drug prices often vary depending on where the prescription is filled and some pharmacies may offer cheaper prices.   The website www.goodrx.com contains coupons for medications through different pharmacies. The prices here do not account for what the cost may be with help from insurance (it may be cheaper  with your insurance), but the website can give you the price if you did not use any insurance.  - You can print the associated coupon and take it with your prescription to the pharmacy.  - You may also stop by our office during regular business hours and pick up a GoodRx coupon card.  - If you need your prescription sent electronically to a different pharmacy, notify our office through [facility] or by phone at [phone]

## 2023-12-07 ENCOUNTER — Encounter: Payer: Self-pay | Admitting: Dermatology

## 2023-12-07 MED ORDER — SPIRONOLACTONE 100 MG PO TABS: 100.0000 mg | ORAL_TABLET | Freq: Every evening | ORAL | 2 refills | Status: DC

## 2023-12-07 NOTE — Therapy (Unsigned)
 OUTPATIENT PHYSICAL THERAPY THORACOLUMBAR EVALUATION   Patient Name: Jessica Kennedy MRN: [id number] DOB:Feb 22, 1971, 53 y.o., female Today's Date: 12/08/2023  END OF SESSION:  PT End of Session - 12/08/23 0932     Visit Number 1    Number of Visits 10    Date for PT Re-Evaluation 01/05/24    Authorization Type [employer]    Authorization Time Period Auth requested    Progress Note Due on Visit 10    PT Start Time 0932    PT Stop Time 1015    PT Time Calculation (min) 43 min    Activity Tolerance Patient tolerated treatment well    Behavior During Therapy [facility] for tasks assessed/performed          Past Medical History:  Diagnosis Date   Hair loss    Hyperlipidemia    Past Surgical History:  Procedure Laterality Date   CESAREAN SECTION  03/22/2007   CESAREAN SECTION  02/09/2004   Patient Active Problem List   Diagnosis Date Noted   Lumbar back pain 09/16/2023   Left ankle pain 06/23/2023   Encounter for screening for cardiovascular disorders 06/23/2023   Chronic venous insufficiency 03/01/2023   Obesity with serious comorbidity 02/07/2023   Obesity 06/15/2015   Hyperlipidemia 03/19/2015    PCP: Terry Wilhelmena Lloyd Hilario, FNP  REFERRING PROVIDER: Terry Wilhelmena Lloyd Hilario, FNP  REFERRING DIAG: [phone] (ICD-10-CM) - Degeneration of intervertebral disc of lumbar region with discogenic back pain and lower extremity pain  Rationale for Evaluation and Treatment: Rehabilitation  THERAPY DIAG:  Other low back pain  Muscle weakness (generalized)  Radicular pain of right lower extremity  ONSET DATE: Quite some time, ~a year  SUBJECTIVE:                                                                                                                                                                                           SUBJECTIVE STATEMENT: Patient reports she has had back pain for at least a year. It feels like pressure. When performing hip  abduction and/or rotation such as getting RLE out of car, she feels sharp pain in groin, buttock, and deep in hip region, only on R side. Leg pain travels down back leg of sometimes, feels like nerve pain.   PERTINENT HISTORY:  Twisted ankle within last year  PAIN:  Are you having pain? Yes: NPRS scale: 3/10 Pain location: Low back, RLE Pain description: Pressure, achy, N/T Aggravating factors: Hip abduction, hip rotation   Relieving factors: Rest  PRECAUTIONS: None  RED FLAGS: None   WEIGHT BEARING RESTRICTIONS: No  FALLS:  Has patient fallen  in last 6 months? Yes. Number of falls 1. Took a misstep  LIVING ENVIRONMENT:  Stairs: Yes: Internal: 13 steps; on right going up and External: 13 steps; can reach both Has following equipment at home: None  OCCUPATION: Care giver. Full time  PLOF: Independent  PATIENT GOALS: Would like to be able to identify the pain where its coming from and as I start to exercise, if you guys can help to get me over the bump (pain threshold) and to just stay motivated to exercise get back into yoga  NEXT MD VISIT: Sept 24th   OBJECTIVE:  Note: Objective measures were completed at Evaluation unless otherwise noted.  DIAGNOSTIC FINDINGS:  FINDINGS: There is no evidence of lumbar spine fracture. Mild curvature of spine. Mild narrow intervertebral space at L5-S1. Minimal anterior spurring noted in the lower thoracic and upper lumbar spine.   IMPRESSION: Mild degenerative joint changes of lumbar spine.    PATIENT SURVEYS:  Modified Oswestry:  Modified Oswestry Low Back Pain Disability Questionnaire: 10 / 50 = 20.0 %  Interpretation of scores: Score Category Description  0-20% Minimal Disability The patient can cope with most living activities. Usually no treatment is indicated apart from advice on lifting, sitting and exercise  21-40% Moderate Disability The patient experiences more pain and difficulty with sitting, lifting and standing.  Travel and social life are more difficult and they may be disabled from work. Personal care, sexual activity and sleeping are not grossly affected, and the patient can usually be managed by conservative means  41-60% Severe Disability Pain remains the main problem in this group, but activities of daily living are affected. These patients require a detailed investigation  61-80% Crippled Back pain impinges on all aspects of the patient's life. Positive intervention is required  81-100% Bed-bound  These patients are either bed-bound or exaggerating their symptoms  Bluford FORBES Zoe DELENA Karon DELENA, et al. Surgery versus conservative management of stable thoracolumbar fracture: the PRESTO feasibility RCT. [place] ([place]): [employer]; 2021 Nov. [facility] [employer] Assessment, No. 25.62.) Appendix 3, Oswestry Disability Index category descriptors. Available from: FindJewel[employer].cz  Minimally Clinically Important Difference (MCID) = 12.8%  COGNITION: Overall cognitive status: Within functional limits for tasks assessed      POSTURE: rounded shoulders  PALPATION: TTP with grade 1-2 CPA throughout lumbar spine  LUMBAR ROM:   AROM eval  Flexion WFL, discomfort on R groin/hip  Extension 50 % avail   Right lateral flexion To knee joint, inc N/T down R leg  Left lateral flexion To knee  jt  Right rotation WFL, discomfort on R groin/hip  Left rotation WFL   (Blank rows = not tested)  LOWER EXTREMITY ROM:     Active  Right eval Left eval  Hip flexion    Hip extension    Hip abduction    Hip adduction    Hip internal rotation    Hip external rotation    Knee flexion    Knee extension    Ankle dorsiflexion    Ankle plantarflexion    Ankle inversion    Ankle eversion     (Blank rows = not tested)  LOWER EXTREMITY MMT:    MMT Right eval Left eval  Hip flexion 4- 4+  Hip extension 3+ 3+  Hip abduction 4- 4  Hip adduction    Hip internal  rotation    Hip external rotation    Knee flexion    Knee extension    Ankle dorsiflexion 5 5  Ankle plantarflexion    Ankle inversion    Ankle eversion     (Blank rows = not tested)  LUMBAR SPECIAL TESTS:  FABER test: Positive on Right   FUNCTIONAL TESTS:  5 times sit to stand: 16  GAIT: Distance walked: 100 ft Assistive device utilized: None Level of assistance: Complete Independence Comments: WFL  TREATMENT DATE:  12/08/23: PT Eval and HEP                                                                                                                                 PATIENT EDUCATION:  Education details: PT evaluation, objective findings, POC, Importance of HEP, Precautions, Clinic policies  Person educated: Patient Education method: Explanation and Demonstration Education comprehension: verbalized understanding and returned demonstration  HOME EXERCISE PROGRAM: Access Code: [email or web address] URL: [email or web address] Date: 12/08/2023 Prepared by: Rosaria Powell-Butler  Exercises - Supine Posterior Pelvic Tilt  - 2 x daily - 7 x weekly - 3 sets - 10 reps - Supine Lower Trunk Rotation  - 2 x daily - 7 x weekly - 3 sets - 10 reps - Supine Figure 4 Piriformis Stretch  - 2 x daily - 7 x weekly - 3 sets - 10 reps - 5 hold - Hooklying Single Knee to Chest Stretch  - 2 x daily - 7 x weekly - 3 sets - 10 reps - 10 hold - Sit to Stand  - 2 x daily - 7 x weekly - 3 sets - 10 reps  ASSESSMENT:  CLINICAL IMPRESSION: Patient is a 53 y.o. female who was seen today for physical therapy evaluation and treatment for M51.362 (ICD-10-CM) - Degeneration of intervertebral disc of lumbar region with discogenic back pain and lower extremity pain. On this date, patient demonstrates decreased lumbar ROM specifically in lumbar extension, decreased LE strength, increased discomfort with palpation and increased radiating pain into RLE with lumbar rotation and lateral flexion. Pt has  positive FABER test this date indicating possible hip joint involvement as well. Patient will benefit from skilled physical therapy in order to address the above/below deficits to improve function and quality of life.    OBJECTIVE IMPAIRMENTS: decreased activity tolerance, decreased endurance, decreased mobility, decreased ROM, decreased strength, impaired flexibility, improper body mechanics, postural dysfunction, and pain.   ACTIVITY LIMITATIONS: carrying, lifting, bending, sitting, squatting, stairs, and transfers  PARTICIPATION LIMITATIONS: cleaning, laundry, and occupation  PERSONAL FACTORS: N/A are also affecting patient's functional outcome.   REHAB POTENTIAL: Good  CLINICAL DECISION MAKING: Stable/uncomplicated  EVALUATION COMPLEXITY: Low   GOALS: Goals reviewed with patient? No  SHORT TERM GOALS: Target date: 12/29/23 Patient will be independent with performance of HEP to demonstrate adequate self management of symptoms.  Baseline:  Goal status: INITIAL  2.   Patient will report at least a 25% improvement with function and/or pain reduction overall since beginning PT. Baseline:  Goal status: INITIAL   LONG TERM GOALS: Target date: 02/02/24 Patient will  improve Modified Oswestry score by 12.8 % in order to demonstrate improved self-perceived disability and overall function while meeting MCID.  Baseline: Goal status: INITIAL   2.  Patient will improve  5 Times Sit to Stand  test by 3 seconds  in order to demonstrate improved LE strength/power required for functional transfers and community ambulation. Baseline:  Goal status: INITIAL   3.  Patient will report little pain/discomfort (2/10 or less) with forward lumbar flexion in order to demonstrate improvement with completing ADLs.  Baseline:  Goal status: INITIAL   4.  Patient will report overall 50% improvement since beginning PT. Baseline:  Goal status: INITIAL   PLAN:  PT FREQUENCY: 1-2x/week  PT DURATION:  8 weeks  PLANNED INTERVENTIONS: 97164- PT Re-evaluation, 97110-Therapeutic exercises, 97530- Therapeutic activity, [id number]- Neuromuscular re-education, 97535- Self Care, 02859- Manual therapy, [id number]- Gait training, [phone]- Electrical stimulation (manual), [id number]- Traction (mechanical), [phone] (1-2 muscles), [id number] (3+ muscles)- Dry Needling, Patient/Family education, Balance training, Stair training, Taping, Joint mobilization, Spinal mobilization, Cryotherapy, and Moist heat.  PLAN FOR NEXT SESSION: Review HEP and goals, Add bridge to HEP, progress lumbar and hip ROM/mobility, LE strengthening, core strengthening   12:59 PM, 12/08/23 Rosaria Settler, PT, DPT [facility] [place]

## 2023-12-08 ENCOUNTER — Other Ambulatory Visit: Payer: Self-pay

## 2023-12-08 ENCOUNTER — Encounter (HOSPITAL_COMMUNITY): Payer: Self-pay

## 2023-12-08 ENCOUNTER — Ambulatory Visit (HOSPITAL_COMMUNITY): Attending: Family Medicine

## 2023-12-08 DIAGNOSIS — M5459 Other low back pain: Secondary | ICD-10-CM | POA: Diagnosis present

## 2023-12-08 DIAGNOSIS — M5410 Radiculopathy, site unspecified: Secondary | ICD-10-CM | POA: Insufficient documentation

## 2023-12-08 DIAGNOSIS — M6281 Muscle weakness (generalized): Secondary | ICD-10-CM | POA: Insufficient documentation

## 2023-12-08 DIAGNOSIS — M51362 Other intervertebral disc degeneration, lumbar region with discogenic back pain and lower extremity pain: Secondary | ICD-10-CM | POA: Insufficient documentation

## 2023-12-09 ENCOUNTER — Telehealth: Payer: Self-pay | Admitting: Family Medicine

## 2023-12-09 ENCOUNTER — Other Ambulatory Visit: Payer: Self-pay | Admitting: Family Medicine

## 2023-12-09 DIAGNOSIS — E782 Mixed hyperlipidemia: Secondary | ICD-10-CM

## 2023-12-09 DIAGNOSIS — Z1211 Encounter for screening for malignant neoplasm of colon: Secondary | ICD-10-CM

## 2023-12-09 DIAGNOSIS — Z1231 Encounter for screening mammogram for malignant neoplasm of breast: Secondary | ICD-10-CM

## 2023-12-09 NOTE — Telephone Encounter (Signed)
 Message sent to patient

## 2023-12-09 NOTE — Telephone Encounter (Signed)
 Spoke with patient to change appointment to virtual- she is wanting to come in for updated labs a week before so they can be discussed during appointment. Please advise patient through MyChart  Thank you

## 2023-12-09 NOTE — Telephone Encounter (Signed)
 Labs ordered patient can come in one week before visit.    Patient can walk in no appointment needed clinic hours: Monday-Friday 8-11 or 1-4 pm.

## 2023-12-13 ENCOUNTER — Ambulatory Visit (HOSPITAL_COMMUNITY)

## 2023-12-13 ENCOUNTER — Telehealth (HOSPITAL_COMMUNITY): Payer: Self-pay

## 2023-12-13 NOTE — Telephone Encounter (Signed)
 No show #1, called and spoke to pt who stated she overslept today. Reminded next apt date and time and requested to call and cancel/reschedule in the future if unable to make it next apt.   Augustin Mclean, LPTA/CLT; [facility] [phone]

## 2023-12-20 ENCOUNTER — Other Ambulatory Visit: Payer: Self-pay | Admitting: Family Medicine

## 2023-12-22 ENCOUNTER — Ambulatory Visit (HOSPITAL_COMMUNITY)

## 2023-12-27 LAB — BMP8+EGFR
BUN/Creatinine Ratio: 23 (ref 9–23)
BUN: 19 mg/dL (ref 6–24)
CO2: 18 mmol/L — ABNORMAL LOW (ref 20–29)
Calcium: 9.4 mg/dL (ref 8.7–10.2)
Chloride: 108 mmol/L — ABNORMAL HIGH (ref 96–106)
Creatinine, Ser: 0.83 mg/dL (ref 0.57–1.00)
Glucose: 92 mg/dL (ref 70–99)
Potassium: 4.6 mmol/L (ref 3.5–5.2)
Sodium: 139 mmol/L (ref 134–144)
eGFR: 85 mL/min/1.73 (ref >59)

## 2023-12-27 LAB — LIPID PANEL
Chol/HDL Ratio: 4.4 ratio (ref 0.0–4.4)
Cholesterol, Total: 180 mg/dL (ref 100–199)
HDL: 41 mg/dL (ref >39)
LDL Chol Calc (NIH): 92 mg/dL (ref 0–99)
Triglycerides: 283 mg/dL — ABNORMAL HIGH (ref 0–149)
VLDL Cholesterol Cal: 47 mg/dL — ABNORMAL HIGH (ref 5–40)

## 2023-12-27 LAB — CBC WITH DIFFERENTIAL/PLATELET
Basophils Absolute: 0.1 x10E3/uL (ref 0.0–0.2)
Basos: 1 %
EOS (ABSOLUTE): 0.1 x10E3/uL (ref 0.0–0.4)
Eos: 1 %
Hematocrit: 38.3 % (ref 34.0–46.6)
Hemoglobin: 12.7 g/dL (ref 11.1–15.9)
Immature Grans (Abs): 0.1 x10E3/uL (ref 0.0–0.1)
Immature Granulocytes: 1 %
Lymphocytes Absolute: 2.7 x10E3/uL (ref 0.7–3.1)
Lymphs: 33 %
MCH: 33.2 pg — ABNORMAL HIGH (ref 26.6–33.0)
MCHC: 33.2 g/dL (ref 31.5–35.7)
MCV: 100 fL — ABNORMAL HIGH (ref 79–97)
Monocytes Absolute: 0.6 x10E3/uL (ref 0.1–0.9)
Monocytes: 7 %
Neutrophils Absolute: 4.7 x10E3/uL (ref 1.4–7.0)
Neutrophils: 57 %
Platelets: 265 x10E3/uL (ref 150–450)
RBC: 3.83 x10E6/uL (ref 3.77–5.28)
RDW: 11.9 % (ref 11.7–15.4)
WBC: 8.2 x10E3/uL (ref 3.4–10.8)

## 2023-12-28 ENCOUNTER — Ambulatory Visit: Admitting: Family Medicine

## 2023-12-28 ENCOUNTER — Other Ambulatory Visit: Payer: Self-pay | Admitting: Family Medicine

## 2023-12-28 ENCOUNTER — Telehealth: Admitting: Family Medicine

## 2023-12-28 DIAGNOSIS — E781 Pure hyperglyceridemia: Secondary | ICD-10-CM | POA: Diagnosis not present

## 2023-12-28 MED ORDER — FENOFIBRATE 200 MG PO CAPS: 200.0000 mg | ORAL_CAPSULE | Freq: Every day | ORAL | 1 refills | Status: AC

## 2023-12-28 MED ORDER — VITAMIN D3 25 MCG (1000 UT) PO CAPS: 1000.0000 [IU] | ORAL_CAPSULE | Freq: Every day | ORAL | 2 refills | Status: AC

## 2023-12-28 NOTE — Assessment & Plan Note (Signed)
 Triglycerides: 283 mg/dL (elevated)  Patient currently taking fenofibrate  145 mg once daily.  Will increase fenofibrate  to 200 mg once daily for improved triglyceride control.  Reinforce lifestyle measures: low-fat, low-sugar diet, regular exercise, and avoidance of alcohol

## 2023-12-28 NOTE — Progress Notes (Unsigned)
   Virtual Visit via Video Note  I connected with Henlee Raftery on 12/28/23 at  by a video enabled telemedicine application and verified that I am speaking with the correct person using two identifiers.  {Patient [phone]::Home} {Provider Location:[phone]::Home Office}  I discussed the limitations, risks, security, and privacy concerns of performing an evaluation and management service by video and the availability of in person appointments. I also discussed with the patient that there may be a patient responsible charge related to this service. The patient expressed understanding and agreed to proceed.  Subjective: PCP: Terry Wilhelmena Lloyd Hilario, FNP  No chief complaint on file.  HPI   ROS: Per HPI  Current Outpatient Medications:    fenofibrate  micronized (LOFIBRA) 200 MG capsule, Take 1 capsule (200 mg total) by mouth daily before breakfast., Disp: 90 capsule, Rfl: 1   cyclobenzaprine  (FLEXERIL ) 5 MG tablet, Take 1 tablet (5 mg total) by mouth 2 (two) times daily as needed., Disp: 60 tablet, Rfl: 2   minoxidil  (LONITEN ) 2.5 MG tablet, Take 1 tablet (2.5 mg total) by mouth daily., Disp: 60 tablet, Rfl: 9   rosuvastatin  (CRESTOR ) 20 MG tablet, Take 1 tablet (20 mg total) by mouth daily., Disp: 90 tablet, Rfl: 3   Safety Seal Miscellaneous MISC, Apply 1 Application topically in the morning. Medication: Finasteride 0.1% solution, Disp: 30 mL, Rfl: 9   Safety Seal Miscellaneous MISC, Apply 1 Application topically at bedtime. Medication Name: DeLightful (Hydroquinone 5%, Kojic Acid 2%, Vit C 2.5% Hyaluronic Acid 0.1%), Disp: 15 g, Rfl: 6   spironolactone  (ALDACTONE ) 100 MG tablet, Take 1 tablet (100 mg total) by mouth at bedtime., Disp: 30 tablet, Rfl: 2   Vitamin D , Ergocalciferol , (DRISDOL ) 1.25 MG (50000 UNIT) CAPS capsule, Take 1 capsule (50,000 Units total) by mouth every 7 (seven) days., Disp: 12 capsule, Rfl: 2  Observations/Objective: There were no vitals  filed for this visit. Physical Exam  Assessment and Plan: There are no diagnoses linked to this encounter.  Follow Up Instructions: No follow-ups on file.   I discussed the assessment and treatment plan with the patient. The patient was provided an opportunity to ask questions, and all were answered. The patient agreed with the plan and demonstrated an understanding of the instructions.   The patient was advised to call back or seek an in-person evaluation if the symptoms worsen or if the condition fails to improve as anticipated.  The above assessment and management plan was discussed with the patient. The patient verbalized understanding of and has agreed to the management plan.   Hilario Terry Wilhelmena Lloyd, FNP

## 2023-12-28 NOTE — Progress Notes (Addendum)
   Virtual Visit via Video Note  I connected with Jessica Kennedy on 12/28/23 at  8:00 AM EDT by a video enabled telemedicine application and verified that I am speaking with the correct person using two identifiers.  Patient Location: Home Provider Location: Office/Clinic  I discussed the limitations, risks, security, and privacy concerns of performing an evaluation and management service by video and the availability of in person appointments. I also discussed with the patient that there may be a patient responsible charge related to this service. The patient expressed understanding and agreed to proceed.  Subjective: PCP: Terry Wilhelmena Lloyd Hilario, FNP  No chief complaint on file.  HPI Patient presents to via telehealth chronic follow up. For the details of today's visit, please refer to assessment and plan.    ROS: Per HPI  Current Outpatient Medications:    Cholecalciferol (VITAMIN D3) 25 MCG (1000 UT) CAPS, Take 1 capsule (1,000 Units total) by mouth daily., Disp: 60 capsule, Rfl: 2   cyclobenzaprine  (FLEXERIL ) 5 MG tablet, Take 1 tablet (5 mg total) by mouth 2 (two) times daily as needed., Disp: 60 tablet, Rfl: 2   fenofibrate  micronized (LOFIBRA) 200 MG capsule, Take 1 capsule (200 mg total) by mouth daily before breakfast., Disp: 90 capsule, Rfl: 1   minoxidil  (LONITEN ) 2.5 MG tablet, Take 1 tablet (2.5 mg total) by mouth daily., Disp: 60 tablet, Rfl: 9   rosuvastatin  (CRESTOR ) 20 MG tablet, Take 1 tablet (20 mg total) by mouth daily., Disp: 90 tablet, Rfl: 3   Safety Seal Miscellaneous MISC, Apply 1 Application topically in the morning. Medication: Finasteride 0.1% solution, Disp: 30 mL, Rfl: 9   Safety Seal Miscellaneous MISC, Apply 1 Application topically at bedtime. Medication Name: DeLightful (Hydroquinone 5%, Kojic Acid 2%, Vit C 2.5% Hyaluronic Acid 0.1%), Disp: 15 g, Rfl: 6   spironolactone  (ALDACTONE ) 100 MG tablet, Take 1 tablet (100 mg total) by mouth at bedtime.,  Disp: 30 tablet, Rfl: 2   Vitamin D , Ergocalciferol , (DRISDOL ) 1.25 MG (50000 UNIT) CAPS capsule, Take 1 capsule (50,000 Units total) by mouth every 7 (seven) days., Disp: 12 capsule, Rfl: 2  Observations/Objective: There were no vitals filed for this visit. Physical Exam Patient is alert and no acute distress noted.   Assessment and Plan: Hypertriglyceridemia Assessment & Plan: Triglycerides: 283 mg/dL (elevated)  Patient currently taking fenofibrate  145 mg once daily.  Will increase fenofibrate  to 200 mg once daily for improved triglyceride control.  Reinforce lifestyle measures: low-fat, low-sugar diet, regular exercise, and avoidance of alcohol   Other orders -     Vitamin D3; Take 1 capsule (1,000 Units total) by mouth daily.  Dispense: 60 capsule; Refill: 2    Follow Up Instructions: No follow-ups on file.   I discussed the assessment and treatment plan with the patient. The patient was provided an opportunity to ask questions, and all were answered. The patient agreed with the plan and demonstrated an understanding of the instructions.   The patient was advised to call back or seek an in-person evaluation if the symptoms worsen or if the condition fails to improve as anticipated.  The above assessment and management plan was discussed with the patient. The patient verbalized understanding of and has agreed to the management plan.   Hilario Terry Wilhelmena Lloyd, FNP

## 2023-12-29 ENCOUNTER — Telehealth (HOSPITAL_COMMUNITY): Payer: Self-pay

## 2023-12-29 ENCOUNTER — Encounter (HOSPITAL_COMMUNITY)

## 2023-12-29 NOTE — Telephone Encounter (Signed)
 No show #2, called and left message concerning missed apt today. Reminded next apt date and time with contact number included if needs to reschedule or cancel apts in the future.   Augustin Mclean, LPTA/CLT; [facility] [phone]

## 2024-01-05 ENCOUNTER — Encounter (HOSPITAL_COMMUNITY)

## 2024-01-11 ENCOUNTER — Ambulatory Visit (HOSPITAL_COMMUNITY): Attending: Family Medicine

## 2024-01-11 ENCOUNTER — Encounter (HOSPITAL_COMMUNITY): Payer: Self-pay

## 2024-01-11 DIAGNOSIS — M5410 Radiculopathy, site unspecified: Secondary | ICD-10-CM | POA: Insufficient documentation

## 2024-01-11 DIAGNOSIS — M6281 Muscle weakness (generalized): Secondary | ICD-10-CM | POA: Diagnosis present

## 2024-01-11 DIAGNOSIS — M5459 Other low back pain: Secondary | ICD-10-CM | POA: Insufficient documentation

## 2024-01-11 NOTE — Therapy (Addendum)
 OUTPATIENT PHYSICAL THERAPY THORACOLUMBAR TREATMENT  Progress Note Reporting Period 12/08/23 to 01/11/24  See note below for Objective Data and Assessment of Progress/Goals.     Patient Name: Jessica Kennedy MRN: [id number] DOB:28-May-1970, 53 y.o., female Today's Date: 01/11/2024  END OF SESSION:  PT End of Session - 01/11/24 0736     Visit Number 2    Number of Visits 10    Date for Recertification  01/05/24    Authorization Type [employer]    Progress Note Due on Visit 10    PT Start Time 0737    PT Stop Time 0812    PT Time Calculation (min) 35 min    Activity Tolerance Patient tolerated treatment well    Behavior During Therapy [facility] for tasks assessed/performed          Past Medical History:  Diagnosis Date   Hair loss    Hyperlipidemia    Past Surgical History:  Procedure Laterality Date   CESAREAN SECTION  03/22/2007   CESAREAN SECTION  02/09/2004   Patient Active Problem List   Diagnosis Date Noted   Hypertriglyceridemia 12/28/2023   Lumbar back pain 09/16/2023   Left ankle pain 06/23/2023   Encounter for screening for cardiovascular disorders 06/23/2023   Chronic venous insufficiency 03/01/2023   Obesity with serious comorbidity 02/07/2023   Obesity 06/15/2015   Hyperlipidemia 03/19/2015    PCP: Terry Wilhelmena Lloyd Hilario, FNP  REFERRING PROVIDER: Terry Wilhelmena Lloyd Hilario, FNP  REFERRING DIAG: [phone] (ICD-10-CM) - Degeneration of intervertebral disc of lumbar region with discogenic back pain and lower extremity pain  Rationale for Evaluation and Treatment: Rehabilitation  THERAPY DIAG:  Other low back pain  Muscle weakness (generalized)  Radicular pain of right lower extremity  ONSET DATE: Quite some time, ~a year  SUBJECTIVE:                                                                                                                                                                                           SUBJECTIVE  STATEMENT: 01/11/24:  Pt hasn't returned to therapy since 12/08/23, stated the schedule has not worked for her but.  Reports she has been compliant with current HEP, began walking for 30 minutes with some pain and have been taking supplements and can tell improvements in the last week.  Current pain scale 3-4/10 occasional sharp pain Rt hip.   Eval:  Patient reports she has had back pain for at least a year. It feels like pressure. When performing hip abduction and/or rotation such as getting RLE out of car, she feels sharp pain in groin, buttock, and deep in  hip region, only on R side. Leg pain travels down back leg of sometimes, feels like nerve pain.   PERTINENT HISTORY:  Twisted ankle within last year  PAIN:  Are you having pain? Yes: NPRS scale: 3/10 Pain location: Low back, RLE Pain description: Pressure, achy, N/T Aggravating factors: Hip abduction, hip rotation   Relieving factors: Rest  PRECAUTIONS: None  RED FLAGS: None   WEIGHT BEARING RESTRICTIONS: No  FALLS:  Has patient fallen in last 6 months? Yes. Number of falls 1. Took a misstep  LIVING ENVIRONMENT:  Stairs: Yes: Internal: 13 steps; on right going up and External: 13 steps; can reach both Has following equipment at home: None  OCCUPATION: Care giver. Full time  PLOF: Independent  PATIENT GOALS: Would like to be able to identify the pain where its coming from and as I start to exercise, if you guys can help to get me over the bump (pain threshold) and to just stay motivated to exercise get back into yoga  NEXT MD VISIT: Sept 24th   OBJECTIVE:  Note: Objective measures were completed at Evaluation unless otherwise noted.  DIAGNOSTIC FINDINGS:  FINDINGS: There is no evidence of lumbar spine fracture. Mild curvature of spine. Mild narrow intervertebral space at L5-S1. Minimal anterior spurring noted in the lower thoracic and upper lumbar spine.   IMPRESSION: Mild degenerative joint changes of lumbar  spine.    PATIENT SURVEYS:  Modified Oswestry:  Modified Oswestry Low Back Pain Disability Questionnaire: 10 / 50 = 20.0 %  Interpretation of scores: Score Category Description  0-20% Minimal Disability The patient can cope with most living activities. Usually no treatment is indicated apart from advice on lifting, sitting and exercise  21-40% Moderate Disability The patient experiences more pain and difficulty with sitting, lifting and standing. Travel and social life are more difficult and they may be disabled from work. Personal care, sexual activity and sleeping are not grossly affected, and the patient can usually be managed by conservative means  41-60% Severe Disability Pain remains the main problem in this group, but activities of daily living are affected. These patients require a detailed investigation  61-80% Crippled Back pain impinges on all aspects of the patient's life. Positive intervention is required  81-100% Bed-bound  These patients are either bed-bound or exaggerating their symptoms  Bluford FORBES Zoe DELENA Karon DELENA, et al. Surgery versus conservative management of stable thoracolumbar fracture: the PRESTO feasibility RCT. [place] ([place]): [employer]; 2021 Nov. [facility] Technology Assessment, No. 25.62.) Appendix 3, Oswestry Disability Index category descriptors. Available from: FindJewel[employer].cz  Minimally Clinically Important Difference (MCID) = 12.8%  COGNITION: Overall cognitive status: Within functional limits for tasks assessed      POSTURE: rounded shoulders  PALPATION: TTP with grade 1-2 CPA throughout lumbar spine  LUMBAR ROM:   AROM eval 01/11/24:  Flexion WFL, discomfort on R groin/hip WFL pain free  Extension 50 % avail  WNL  Right lateral flexion To knee joint, inc N/T down R leg To Rt knee joint  Left lateral flexion To knee  jt To Lt knee joint  Right rotation WFL, discomfort on R groin/hip WNL, pain free  Left  rotation WFL WNL   (Blank rows = not tested)  LOWER EXTREMITY ROM:     Active  Right eval Left eval  Hip flexion    Hip extension    Hip abduction    Hip adduction    Hip internal rotation    Hip external rotation  Knee flexion    Knee extension    Ankle dorsiflexion    Ankle plantarflexion    Ankle inversion    Ankle eversion     (Blank rows = not tested)  LOWER EXTREMITY MMT:    MMT Right eval Left eval Right 01/11/24 Left  01/11/24  Hip flexion 4- 4+ 4/5 5/5  Hip extension 3+ 3+ 3+ *anterior/lateral hip pain 4/5  Hip abduction 4- 4 4 4+  Hip adduction      Hip internal rotation      Hip external rotation      Knee flexion   5 5  Knee extension      Ankle dorsiflexion 5 5 5 5   Ankle plantarflexion      Ankle inversion      Ankle eversion       (Blank rows = not tested)  LUMBAR SPECIAL TESTS:  FABER test: Positive on Right  01/11/24:  FABER not painful but feels a pull  FUNCTIONAL TESTS:  5 times sit to stand: 16 01/11/24:  5STS 14.39  GAIT: Distance walked: 100 ft Assistive device utilized: None Level of assistance: Complete Independence Comments: WFL  TREATMENT DATE:  01/11/24:   Reviewed goals Educated importance of HEP complinace 5STS 14.39 546ft reports tightness anterior hip ROM see above MMT see above Reports of Rt hip pulling during log rolling for bed mobility Faber test  Supine:  - bridge 10x5 - posterior pelvic tilt 10x5 NMR to improve mechanics - SKTC -LTR  12/08/23: PT Eval and HEP                                                                                                                                 PATIENT EDUCATION:  Education details: PT evaluation, objective findings, POC, Importance of HEP, Precautions, Clinic policies  Person educated: Patient Education method: Explanation and Demonstration Education comprehension: verbalized understanding and returned demonstration  HOME EXERCISE PROGRAM: Access  Code: [email or web address] URL: [email or web address] Date: 12/08/2023 Prepared by: Rosaria Powell-Butler  Exercises - Supine Posterior Pelvic Tilt  - 2 x daily - 7 x weekly - 3 sets - 10 reps - Supine Lower Trunk Rotation  - 2 x daily - 7 x weekly - 3 sets - 10 reps - Supine Figure 4 Piriformis Stretch  - 2 x daily - 7 x weekly - 3 sets - 10 reps - 5 hold - Hooklying Single Knee to Chest Stretch  - 2 x daily - 7 x weekly - 3 sets - 10 reps - 10 hold - Sit to Stand  - 2 x daily - 7 x weekly - 3 sets - 10 reps  01/11/24: GLENWOOD Pander  ASSESSMENT:  CLINICAL IMPRESSION: 01/11/24:  Reviewed goals and educated importance of HEP compliance as well as attendance with PT for maximal benefits.  Pt has not returned to therapy since initial eval on 12/08/23.  Pt stated she finds early apts the best  for her schedule.  Pt able to recall current exercise program with cueing required to improve mechanics with posterior pelvic tilt, incorporated breathing paired with abdominal set and posterior pelvic tilt with improved mechanics following and reports of pain relief in position.  Objective testing including 5STS, , ROM and MMT with improvements noted.  Pt would like to continue PT to address goals unmet and feels 1x/week the best for her schedule.  Plan to continue PT for 3 more weeks to address goals unmet.  Pt interested in yoga exercises, plan to incorporate in future apts.    Eval:  Patient is a 53 y.o. female who was seen today for physical therapy evaluation and treatment for M51.362 (ICD-10-CM) - Degeneration of intervertebral disc of lumbar region with discogenic back pain and lower extremity pain. On this date, patient demonstrates decreased lumbar ROM specifically in lumbar extension, decreased LE strength, increased discomfort with palpation and increased radiating pain into RLE with lumbar rotation and lateral flexion. Pt has positive FABER test this date indicating possible hip joint involvement as  well. Patient will benefit from skilled physical therapy in order to address the above/below deficits to improve function and quality of life.    OBJECTIVE IMPAIRMENTS: decreased activity tolerance, decreased endurance, decreased mobility, decreased ROM, decreased strength, impaired flexibility, improper body mechanics, postural dysfunction, and pain.   ACTIVITY LIMITATIONS: carrying, lifting, bending, sitting, squatting, stairs, and transfers  PARTICIPATION LIMITATIONS: cleaning, laundry, and occupation  PERSONAL FACTORS: N/A are also affecting patient's functional outcome.   REHAB POTENTIAL: Good  CLINICAL DECISION MAKING: Stable/uncomplicated  EVALUATION COMPLEXITY: Low   GOALS: Goals reviewed with patient? No  SHORT TERM GOALS: Target date: 12/29/23 Patient will be independent with performance of HEP to demonstrate adequate self management of symptoms.  Baseline: 10/0/25: Reports compliance with HEP daily and has began walking program Goal status: MET  2.   Patient will report at least a 25% improvement with function and/or pain reduction overall since beginning PT. Baseline:  Goal status: In progress   LONG TERM GOALS: Target date: 02/02/24 Patient will improve Modified Oswestry score by 12.8 % in order to demonstrate improved self-perceived disability and overall function while meeting MCID.  Baseline: not assessed  Goal status: INITIAL   2.  Patient will improve  5 Times Sit to Stand  test by 3 seconds  in order to demonstrate improved LE strength/power required for functional transfers and community ambulation. Baseline: Eval: 5 times sit to stand: 16; 01/11/24:  5STS 14.39 Goal status: In progress   3.  Patient will report little pain/discomfort (2/10 or less) with forward lumbar flexion in order to demonstrate improvement with completing ADLs.  Baseline:  Goal status: INITIAL   4.  Patient will report overall 50% improvement since beginning PT. Baseline:  Goal  status: INITIAL   PLAN:  PT FREQUENCY: 1-2x/week  PT DURATION: 8 weeks  PLANNED INTERVENTIONS: 97164- PT Re-evaluation, 97110-Therapeutic exercises, 97530- Therapeutic activity, [id number]- Neuromuscular re-education, 97535- Self Care, [id number]- Manual therapy, [id number]- Gait training, [phone]- Electrical stimulation (manual), [id number]- Traction (mechanical), [phone] (1-2 muscles), [id number] (3+ muscles)- Dry Needling, Patient/Family education, Balance training, Stair training, Taping, Joint mobilization, Spinal mobilization, Cryotherapy, and Moist heat.  PLAN FOR NEXT SESSION: Progress lumbar and hip ROM/mobility, LE strengthening, core strengthening.  Plan to continues 1x/week until 02/02/24 then reassess.  Next session begin quadruped UE/LE extension, cat/camel, child's pose, Rt hip flexor stretch and progress gluteal strength.  Augustin Mclean, LPTA/CLT; CBIS [phone]   8:27 AM, 01/11/24

## 2024-01-12 ENCOUNTER — Encounter (HOSPITAL_COMMUNITY)

## 2024-01-18 ENCOUNTER — Ambulatory Visit (HOSPITAL_COMMUNITY): Admitting: Physical Therapy

## 2024-01-18 DIAGNOSIS — M5410 Radiculopathy, site unspecified: Secondary | ICD-10-CM

## 2024-01-18 DIAGNOSIS — M6281 Muscle weakness (generalized): Secondary | ICD-10-CM

## 2024-01-18 DIAGNOSIS — M5459 Other low back pain: Secondary | ICD-10-CM

## 2024-01-18 NOTE — Therapy (Signed)
 OUTPATIENT PHYSICAL THERAPY THORACOLUMBAR TREATMENT     Patient Name: Jessica Kennedy MRN: [id number] DOB:December 31, 1970, 53 y.o., female Today's Date: 01/18/2024  END OF SESSION:  PT End of Session - 01/18/24 0745     Visit Number 3    Number of Visits 10    Date for Recertification  02/02/24    Authorization Type [employer]    Authorization Time Period [employer] approved 16 visits from 12/08/2023-02/02/2024    Authorization - Visit Number 3    Authorization - Number of Visits 16    Progress Note Due on Visit 10    PT Start Time 0739    PT Stop Time 0820    PT Time Calculation (min) 41 min    Activity Tolerance Patient tolerated treatment well    Behavior During Therapy [facility] for tasks assessed/performed          Past Medical History:  Diagnosis Date   Hair loss    Hyperlipidemia    Past Surgical History:  Procedure Laterality Date   CESAREAN SECTION  03/22/2007   CESAREAN SECTION  02/09/2004   Patient Active Problem List   Diagnosis Date Noted   Hypertriglyceridemia 12/28/2023   Lumbar back pain 09/16/2023   Left ankle pain 06/23/2023   Encounter for screening for cardiovascular disorders 06/23/2023   Chronic venous insufficiency 03/01/2023   Obesity with serious comorbidity 02/07/2023   Obesity 06/15/2015   Hyperlipidemia 03/19/2015    PCP: Terry Wilhelmena Lloyd Hilario, FNP  REFERRING PROVIDER: Terry Wilhelmena Lloyd Hilario, FNP  REFERRING DIAG: [phone] (ICD-10-CM) - Degeneration of intervertebral disc of lumbar region with discogenic back pain and lower extremity pain  Rationale for Evaluation and Treatment: Rehabilitation  THERAPY DIAG:  Other low back pain  Muscle weakness (generalized)  Radicular pain of right lower extremity  ONSET DATE: Quite some time, ~a year  SUBJECTIVE:                                                                                                                                                                                            SUBJECTIVE STATEMENT: 01/18/24:  pt comes today with natural remedy she purchased from [place] and began taking for her pain that is really working for her.  Supplement contains seamoss, moringa, oregano, tumeric, and black seed oil.  Pt reports she goes to work following appt and sits most of the day at her job.  Reports compliance with HEP, reports she was walking for 30 minutes everyday but hasn't the past couple of days.  Currently 3/10 occasional sharp pain Rt hip.   Eval:  Patient reports she has had back  pain for at least a year. It feels like pressure. When performing hip abduction and/or rotation such as getting RLE out of car, she feels sharp pain in groin, buttock, and deep in hip region, only on R side. Leg pain travels down back leg of sometimes, feels like nerve pain.   PERTINENT HISTORY:  Twisted ankle within last year  PAIN:  Are you having pain? Yes: NPRS scale: 3/10 Pain location: Low back, RLE Pain description: Pressure, achy, N/T Aggravating factors: Hip abduction, hip rotation   Relieving factors: Rest  PRECAUTIONS: None  RED FLAGS: None   WEIGHT BEARING RESTRICTIONS: No  FALLS:  Has patient fallen in last 6 months? Yes. Number of falls 1. Took a misstep  LIVING ENVIRONMENT:  Stairs: Yes: Internal: 13 steps; on right going up and External: 13 steps; can reach both Has following equipment at home: None  OCCUPATION: Care giver. Full time  PLOF: Independent  PATIENT GOALS: Would like to be able to identify the pain where its coming from and as I start to exercise, if you guys can help to get me over the bump (pain threshold) and to just stay motivated to exercise get back into yoga  NEXT MD VISIT: Sept 24th   OBJECTIVE:  Note: Objective measures were completed at Evaluation unless otherwise noted.  DIAGNOSTIC FINDINGS:  FINDINGS: There is no evidence of lumbar spine fracture. Mild curvature of spine. Mild narrow intervertebral  space at L5-S1. Minimal anterior spurring noted in the lower thoracic and upper lumbar spine.   IMPRESSION: Mild degenerative joint changes of lumbar spine.    PATIENT SURVEYS:  Modified Oswestry:  Modified Oswestry Low Back Pain Disability Questionnaire: 10 / 50 = 20.0 %  Interpretation of scores: Score Category Description  0-20% Minimal Disability The patient can cope with most living activities. Usually no treatment is indicated apart from advice on lifting, sitting and exercise  21-40% Moderate Disability The patient experiences more pain and difficulty with sitting, lifting and standing. Travel and social life are more difficult and they may be disabled from work. Personal care, sexual activity and sleeping are not grossly affected, and the patient can usually be managed by conservative means  41-60% Severe Disability Pain remains the main problem in this group, but activities of daily living are affected. These patients require a detailed investigation  61-80% Crippled Back pain impinges on all aspects of the patient's life. Positive intervention is required  81-100% Bed-bound  These patients are either bed-bound or exaggerating their symptoms  Bluford FORBES Zoe DELENA Karon DELENA, et al. Surgery versus conservative management of stable thoracolumbar fracture: the PRESTO feasibility RCT. [place] ([place]): [employer]; 2021 Nov. [facility] Technology Assessment, No. 25.62.) Appendix 3, Oswestry Disability Index category descriptors. Available from: FindJewelers.cz  Minimally Clinically Important Difference (MCID) = 12.8%  COGNITION: Overall cognitive status: Within functional limits for tasks assessed      POSTURE: rounded shoulders  PALPATION: TTP with grade 1-2 CPA throughout lumbar spine  LUMBAR ROM:   AROM eval 01/11/24:  Flexion WFL, discomfort on R groin/hip WFL pain free  Extension 50 % avail  WNL  Right lateral flexion To knee joint, inc  N/T down R leg To Rt knee joint  Left lateral flexion To knee  jt To Lt knee joint  Right rotation WFL, discomfort on R groin/hip WNL, pain free  Left rotation WFL WNL   (Blank rows = not tested)  LOWER EXTREMITY ROM:     Active  Right eval Left  eval  Hip flexion    Hip extension    Hip abduction    Hip adduction    Hip internal rotation    Hip external rotation    Knee flexion    Knee extension    Ankle dorsiflexion    Ankle plantarflexion    Ankle inversion    Ankle eversion     (Blank rows = not tested)  LOWER EXTREMITY MMT:    MMT Right eval Left eval Right 01/11/24 Left  01/11/24  Hip flexion 4- 4+ 4/5 5/5  Hip extension 3+ 3+ 3+ *anterior/lateral hip pain 4/5  Hip abduction 4- 4 4 4+  Hip adduction      Hip internal rotation      Hip external rotation      Knee flexion   5 5  Knee extension      Ankle dorsiflexion 5 5 5 5   Ankle plantarflexion      Ankle inversion      Ankle eversion       (Blank rows = not tested)  LUMBAR SPECIAL TESTS:  FABER test: Positive on Right  01/11/24:  FABER not painful but feels a pull  FUNCTIONAL TESTS:  5 times sit to stand: 16 01/11/24:  5STS 14.39  GAIT: Distance walked: 100 ft Assistive device utilized: None Level of assistance: Complete Independence Comments: WFL  TREATMENT DATE:  01/18/24 Supine:  PPT 10X  Abdominal isometrics 10X5  Bridge 10X5  SLR with core iso 10X each side  LTR 10X5 Quadruped mad cat old horse 10X5  Mid back stretch 3X20  Bird dogs opp UE/LE 5X each   01/11/24:   Reviewed goals Educated importance of HEP complinace 5STS 14.39 524ft reports tightness anterior hip ROM see above MMT see above Reports of Rt hip pulling during log rolling for bed mobility Faber test  Supine:  - bridge 10x5 - posterior pelvic tilt 10x5 NMR to improve mechanics - SKTC -LTR  12/08/23: PT Eval and HEP                                                                                                                                  PATIENT EDUCATION:  Education details: PT evaluation, objective findings, POC, Importance of HEP, Precautions, Clinic policies  Person educated: Patient Education method: Explanation and Demonstration Education comprehension: verbalized understanding and returned demonstration  HOME EXERCISE PROGRAM: Access Code: [email or web address] URL: [email or web address] Date: 12/08/2023 Prepared by: Rosaria Powell-Butler  Exercises - Supine Posterior Pelvic Tilt  - 2 x daily - 7 x weekly - 3 sets - 10 reps - Supine Lower Trunk Rotation  - 2 x daily - 7 x weekly - 3 sets - 10 reps - Supine Figure 4 Piriformis Stretch  - 2 x daily - 7 x weekly - 3 sets - 10 reps - 5 hold - Hooklying Single Knee to Chest Stretch  - 2 x daily - 7 x weekly -  3 sets - 10 reps - 10 hold - Sit to Stand  - 2 x daily - 7 x weekly - 3 sets - 10 reps  01/11/24: GLENWOOD Pander  Access Code: [email or web address] URL: [email or web address] Date: 01/18/2024 Prepared by: Greig Fuse - Cat Cow  - 2 x daily - 7 x weekly - 3 reps - 20 sec hold - Cat to Child's Pose with Posterior Pelvic Tilt  - 2 x daily - 7 x weekly - 3 reps - 20 sec hold - Quadruped Pelvic Floor Contraction with Opposite Arm and Leg Lift  - 2 x daily - 7 x weekly - 2 sets - 5 reps - Supine Pelvic Tilt with Straight Leg Raise  - 2 x daily - 7 x weekly - 10 reps - Supine Transversus Abdominis Bracing - Hands on Stomach  - 2 x daily - 7 x weekly - 10 reps - 5 sec hold  ASSESSMENT:  CLINICAL IMPRESSION: 01/18/24:  Reviewed established HEP with cues needed for general form and hold times.  Educated on engaging core with all activities and began to utilize with all exercises today.  Quadruped stretches and stabilization began with most difficulty with maintaining balance with opposite UE/LE lifts.  Pt frequently completes her breathing and own stretches intermittently between instructed therex.  Some ballistic  movements/twisting stretches noted that will need to be monitored.  Updated HEP to include added exercises today.  Pt without c/o pain or issues during or at completion of session today. Pt will continue to benefit from skilled PT to address unmet goals.    Eval:  Patient is a 53 y.o. female who was seen today for physical therapy evaluation and treatment for M51.362 (ICD-10-CM) - Degeneration of intervertebral disc of lumbar region with discogenic back pain and lower extremity pain. On this date, patient demonstrates decreased lumbar ROM specifically in lumbar extension, decreased LE strength, increased discomfort with palpation and increased radiating pain into RLE with lumbar rotation and lateral flexion. Pt has positive FABER test this date indicating possible hip joint involvement as well. Patient will benefit from skilled physical therapy in order to address the above/below deficits to improve function and quality of life.    OBJECTIVE IMPAIRMENTS: decreased activity tolerance, decreased endurance, decreased mobility, decreased ROM, decreased strength, impaired flexibility, improper body mechanics, postural dysfunction, and pain.   ACTIVITY LIMITATIONS: carrying, lifting, bending, sitting, squatting, stairs, and transfers  PARTICIPATION LIMITATIONS: cleaning, laundry, and occupation  PERSONAL FACTORS: N/A are also affecting patient's functional outcome.   REHAB POTENTIAL: Good  CLINICAL DECISION MAKING: Stable/uncomplicated  EVALUATION COMPLEXITY: Low   GOALS: Goals reviewed with patient? No  SHORT TERM GOALS: Target date: 12/29/23 Patient will be independent with performance of HEP to demonstrate adequate self management of symptoms.  Baseline: 10/0/25: Reports compliance with HEP daily and has began walking program Goal status: MET  2.   Patient will report at least a 25% improvement with function and/or pain reduction overall since beginning PT. Baseline:  Goal status: In  progress   LONG TERM GOALS: Target date: 02/02/24 Patient will improve Modified Oswestry score by 12.8 % in order to demonstrate improved self-perceived disability and overall function while meeting MCID.  Baseline: not assessed  Goal status: INITIAL   2.  Patient will improve  5 Times Sit to Stand  test by 3 seconds  in order to demonstrate improved LE strength/power required for functional transfers and community ambulation. Baseline: Eval: 5 times sit to stand: 16; 01/11/24:  5STS 14.39  Goal status: In progress   3.  Patient will report little pain/discomfort (2/10 or less) with forward lumbar flexion in order to demonstrate improvement with completing ADLs.  Baseline:  Goal status: INITIAL   4.  Patient will report overall 50% improvement since beginning PT. Baseline:  Goal status: INITIAL   PLAN:  PT FREQUENCY: 1-2x/week  PT DURATION: 8 weeks  PLANNED INTERVENTIONS: 97164- PT Re-evaluation, 97110-Therapeutic exercises, 97530- Therapeutic activity, [id number]- Neuromuscular re-education, 97535- Self Care, [id number]- Manual therapy, [id number]- Gait training, [phone]- Electrical stimulation (manual), [id number]- Traction (mechanical), [phone] (1-2 muscles), 20561 (3+ muscles)- Dry Needling, Patient/Family education, Balance training, Stair training, Taping, Joint mobilization, Spinal mobilization, Cryotherapy, and Moist heat.  PLAN FOR NEXT SESSION: Progress lumbar and hip ROM/mobility, LE strengthening, core strengthening.  Plan to continues 1x/week until 02/02/24 then reassess.  Next session begin yoga based therex as expressed interest in these.  Review quadruped exercises added last session.  Greig KATHEE Fuse, PTA/CLT [facility] Ph: [phone]   8:22 AM, 01/18/24

## 2024-01-19 ENCOUNTER — Encounter (HOSPITAL_COMMUNITY)

## 2024-01-25 ENCOUNTER — Telehealth (HOSPITAL_COMMUNITY): Payer: Self-pay

## 2024-01-25 ENCOUNTER — Encounter (HOSPITAL_COMMUNITY)

## 2024-01-25 NOTE — Telephone Encounter (Signed)
 3rd no show, called and left message concerning missed apt today and will be discharged due to no show policy. Included contact number for any questions concerning and encouraged to contact MD for new referral if wishes to resume therapy.   Augustin Mclean, LPTA/CLT; [facility] [phone]

## 2024-01-26 ENCOUNTER — Encounter (HOSPITAL_COMMUNITY)

## 2024-01-26 NOTE — Therapy (Signed)
 PHYSICAL THERAPY DISCHARGE SUMMARY  Visits from Start of Care: 3  Current functional level related to goals / functional outcomes: See last visit on 01/18/24   Remaining deficits: See last visit on 01/18/24   Education / Equipment: HEP   Patient agrees to discharge. Patient goals were not met. Patient is being discharged due to not returning since the last visit. Pt discharged via No Show Policy  1:19 PM, 01/26/24 Rosaria Settler, PT, DPT [facility] [address]

## 2024-01-26 NOTE — Therapy (Deleted)
 PHYSICAL THERAPY DISCHARGE SUMMARY  Visits from Start of Care: 3  Current functional level related to goals / functional outcomes: See last visit on 01/18/24   Remaining deficits: See last visit on 01/18/24   Education / Equipment: HEP   Patient agrees to discharge. Patient goals were not met. Patient is being discharged due to not returning since the last visit. Pt discharged via No Show Policy  1:19 PM, 01/26/24 Rosaria Settler, PT, DPT [facility] [address]

## 2024-02-01 ENCOUNTER — Encounter (HOSPITAL_COMMUNITY)

## 2024-02-02 ENCOUNTER — Encounter (HOSPITAL_COMMUNITY)

## 2024-02-25 ENCOUNTER — Other Ambulatory Visit: Payer: Self-pay | Admitting: Dermatology

## 2024-04-07 ENCOUNTER — Other Ambulatory Visit (HOSPITAL_COMMUNITY): Payer: Self-pay

## 2024-04-25 ENCOUNTER — Telehealth: Payer: Self-pay | Admitting: Family Medicine

## 2024-04-25 NOTE — Telephone Encounter (Signed)
 Per patient she came by the office and said the pharmacy told her to bring her new insurance card to our office and our office had to send into the pharmacy. I have uploaded in the documents new card for [facility].

## 2024-05-31 ENCOUNTER — Ambulatory Visit: Admitting: Dermatology
